# Patient Record
Sex: Female | Born: 1960 | Race: White | Hispanic: No | Marital: Single | Smoking: Never smoker
Health system: Southern US, Community
[De-identification: ages and names within clinical notes are randomized; demographics above are authoritative.]

## PROBLEM LIST (undated history)

## (undated) DIAGNOSIS — K5792 Diverticulitis of intestine, part unspecified, without perforation or abscess without bleeding: Secondary | ICD-10-CM

## (undated) DIAGNOSIS — K859 Acute pancreatitis without necrosis or infection, unspecified: Secondary | ICD-10-CM

## (undated) DIAGNOSIS — R569 Unspecified convulsions: Secondary | ICD-10-CM

## (undated) DIAGNOSIS — K219 Gastro-esophageal reflux disease without esophagitis: Secondary | ICD-10-CM

## (undated) DIAGNOSIS — M8580 Other specified disorders of bone density and structure, unspecified site: Secondary | ICD-10-CM

## (undated) DIAGNOSIS — C541 Malignant neoplasm of endometrium: Secondary | ICD-10-CM

## (undated) DIAGNOSIS — M1990 Unspecified osteoarthritis, unspecified site: Secondary | ICD-10-CM

## (undated) HISTORY — PX: NOSE SURGERY: SHX723

## (undated) HISTORY — PX: ELBOW SURGERY: SHX618

## (undated) HISTORY — DX: Unspecified convulsions: R56.9

## (undated) HISTORY — PX: NASAL SINUS SURGERY: SHX719

## (undated) HISTORY — PX: BREAST SURGERY: SHX581

## (undated) HISTORY — DX: Diverticulitis of intestine, part unspecified, without perforation or abscess without bleeding: K57.92

## (undated) HISTORY — DX: Acute pancreatitis without necrosis or infection, unspecified: K85.90

## (undated) HISTORY — PX: HERNIA REPAIR: SHX51

## (undated) HISTORY — PX: BLADDER DIVERTICULECTOMY: SHX1235

## (undated) HISTORY — PX: UMBILICAL HERNIA REPAIR: SHX196

## (undated) HISTORY — PX: ROTATOR CUFF REPAIR: SHX139

## (undated) HISTORY — DX: Other specified disorders of bone density and structure, unspecified site: M85.80

## (undated) HISTORY — PX: PANCREATECTOMY: SHX1019

## (undated) HISTORY — DX: Gastro-esophageal reflux disease without esophagitis: K21.9

---

## 1979-03-02 HISTORY — PX: UMBILICAL HERNIA REPAIR: SHX196

## 1979-03-02 HISTORY — PX: NASAL SINUS SURGERY: SHX719

## 1998-06-18 ENCOUNTER — Other Ambulatory Visit: Admission: RE | Admit: 1998-06-18 | Discharge: 1998-06-18 | Payer: Self-pay | Admitting: *Deleted

## 1999-08-13 ENCOUNTER — Other Ambulatory Visit: Admission: RE | Admit: 1999-08-13 | Discharge: 1999-08-13 | Payer: Self-pay | Admitting: Obstetrics and Gynecology

## 2000-08-16 ENCOUNTER — Other Ambulatory Visit: Admission: RE | Admit: 2000-08-16 | Discharge: 2000-08-16 | Payer: Self-pay | Admitting: Obstetrics and Gynecology

## 2000-10-04 ENCOUNTER — Encounter (INDEPENDENT_AMBULATORY_CARE_PROVIDER_SITE_OTHER): Payer: Self-pay | Admitting: Specialist

## 2000-10-04 ENCOUNTER — Ambulatory Visit (HOSPITAL_BASED_OUTPATIENT_CLINIC_OR_DEPARTMENT_OTHER): Admission: RE | Admit: 2000-10-04 | Discharge: 2000-10-04 | Payer: Self-pay | Admitting: General Surgery

## 2001-08-22 ENCOUNTER — Other Ambulatory Visit: Admission: RE | Admit: 2001-08-22 | Discharge: 2001-08-22 | Payer: Self-pay | Admitting: Obstetrics & Gynecology

## 2002-09-06 ENCOUNTER — Other Ambulatory Visit: Admission: RE | Admit: 2002-09-06 | Discharge: 2002-09-06 | Payer: Self-pay | Admitting: Obstetrics & Gynecology

## 2003-09-27 ENCOUNTER — Other Ambulatory Visit: Admission: RE | Admit: 2003-09-27 | Discharge: 2003-09-27 | Payer: Self-pay | Admitting: Obstetrics & Gynecology

## 2004-07-13 ENCOUNTER — Ambulatory Visit (HOSPITAL_COMMUNITY): Admission: RE | Admit: 2004-07-13 | Discharge: 2004-07-13 | Payer: Self-pay | Admitting: Neurology

## 2004-10-26 ENCOUNTER — Other Ambulatory Visit: Admission: RE | Admit: 2004-10-26 | Discharge: 2004-10-26 | Payer: Self-pay | Admitting: Obstetrics & Gynecology

## 2006-12-30 ENCOUNTER — Encounter: Admission: RE | Admit: 2006-12-30 | Discharge: 2006-12-30 | Payer: Self-pay | Admitting: Obstetrics & Gynecology

## 2008-05-03 ENCOUNTER — Encounter: Admission: RE | Admit: 2008-05-03 | Discharge: 2008-05-03 | Payer: Self-pay | Admitting: Surgery

## 2008-10-18 ENCOUNTER — Encounter: Admission: RE | Admit: 2008-10-18 | Discharge: 2008-10-18 | Payer: Self-pay | Admitting: Obstetrics and Gynecology

## 2009-10-02 ENCOUNTER — Encounter: Admission: RE | Admit: 2009-10-02 | Discharge: 2009-10-02 | Payer: Self-pay | Admitting: Gastroenterology

## 2009-11-05 ENCOUNTER — Encounter: Payer: Self-pay | Admitting: Cardiology

## 2009-11-07 ENCOUNTER — Ambulatory Visit: Payer: Self-pay | Admitting: Cardiology

## 2009-11-07 DIAGNOSIS — R079 Chest pain, unspecified: Secondary | ICD-10-CM | POA: Insufficient documentation

## 2009-11-18 ENCOUNTER — Ambulatory Visit: Payer: Self-pay | Admitting: Cardiology

## 2009-11-18 ENCOUNTER — Encounter: Payer: Self-pay | Admitting: Cardiology

## 2009-11-18 ENCOUNTER — Ambulatory Visit: Payer: Self-pay

## 2009-11-18 ENCOUNTER — Ambulatory Visit (HOSPITAL_COMMUNITY): Admission: RE | Admit: 2009-11-18 | Discharge: 2009-11-18 | Payer: Self-pay | Admitting: Cardiology

## 2009-11-19 LAB — CONVERTED CEMR LAB
Albumin: 3.9 g/dL (ref 3.5–5.2)
Cholesterol: 194 mg/dL (ref 0–200)
HDL: 43.4 mg/dL (ref >39.00)
LDL Cholesterol: 130 mg/dL — ABNORMAL HIGH (ref 0–99)
Total CHOL/HDL Ratio: 4
Total Protein: 7.1 g/dL (ref 6.0–8.3)
Triglycerides: 103 mg/dL (ref 0.0–149.0)

## 2009-11-21 ENCOUNTER — Ambulatory Visit: Payer: Self-pay | Admitting: Cardiology

## 2009-12-21 ENCOUNTER — Emergency Department (HOSPITAL_COMMUNITY): Admission: EM | Admit: 2009-12-21 | Discharge: 2009-12-21 | Payer: Self-pay | Admitting: Emergency Medicine

## 2010-03-31 NOTE — Assessment & Plan Note (Signed)
Summary: Karina Aguirre Pia Mau   Referring Provider:  [facility] Primary Provider:  Dr. Bartholomew Crews  CC:  follow up.  History of Present Illness: 50 yo with no prior cardiac history presented initially for evaluation of chest pain.  Several weeks ago, while getting in her car to go to work, patient developed pressure in her central chest off and on all day long.  Pain lasted about 10 minutes at a time.  She was short of breath with the pain.  It was not related to exertion or meals.  She started taking omeprazole and has not had any further pain. Stress echo was done, showing no evidence for ischemia or infarction.   Labs (9/11): LDL 130, HDL 43  Current Medications (verified): 1)  Vitamin D3 2000 Unit Caps (Cholecalciferol) .... Once Daily 2)  Omeprazole 40 Mg Cpdr (Omeprazole) .... Take 2 Tablets As Needed  Allergies: No Known Drug Allergies  Past History:  Past Medical History: 1. History of idiopathic pancreatitis 2. GERD 3. History of seizures 4. Stress echo (9/11): Only reached stage II so below average exercise tolerance.  No chest pain.  No evidence for ischemia.   Family History: Reviewed history from 11/07/2009 and no changes required. Mother with MI in her 74s, aortic stenosis.   Social History: Reviewed history from 11/07/2009 and no changes required. Lives in [place], works at [employer] and part-time at [employer] in [place].  Lives alone.  No ETOH, no smoking.   Vital Signs:  Patient profile:   50 year old female Height:      62 inches Weight:      157 pounds BMI:     28.82 Pulse rate:   62 / minute Resp:     14 per minute BP sitting:   129 / 84  (left arm)  Vitals Entered By: Kem Parkinson (November 21, 2009 8:42 AM)  Physical Exam  General:  Well developed, well nourished, in no acute distress. Neck:  Neck supple, no JVD. No masses, thyromegaly or abnormal cervical nodes. Lungs:  Clear bilaterally to auscultation and percussion. Heart:   Non-displaced PMI, chest non-tender; regular rate and rhythm, S1, S2 without murmurs, rubs or gallops. Carotid upstroke normal, no bruit. Pedals normal pulses. No edema, no varicosities. Abdomen:  Bowel sounds positive; abdomen soft and non-tender without masses, organomegaly, or hernias noted. No hepatosplenomegaly. Extremities:  No clubbing or cyanosis. Neurologic:  Alert and oriented x 3. Psych:  Normal affect.   Impression & Recommendations:  Problem # 1:  CHEST PAIN (ICD-786.50) I suspect that this was due to GERD.  It has completely resolved with the use of omeprazole.  Stress echo showed normal LV systolic function and no evidence for ischemia.  Continue omeprazole and no further workup necessary.   Patient Instructions: 1)  Your physician recommends that you schedule a follow-up appointment as needed with Dr Shirlee Latch.

## 2010-03-31 NOTE — Miscellaneous (Signed)
Summary: Orders Update  Clinical Lists Changes  Orders: Added new Test order of TLB-Lipid Panel ([id number]) - Signed Added new Test order of TLB-Hepatic/Liver Function Pnl ([id number]-HEPATIC) - Signed 

## 2010-03-31 NOTE — Assessment & Plan Note (Signed)
Summary: np6/ atypical chest pain. eval for stress test. pt has bcbs/ gd   Referring Provider:  [facility] Primary Provider:  Dr. Bartholomew Crews  CC:  new pt.  atypical chest pain.  Pt states that she has CP all day Wednesday.  She went to pcp and they felt she should have a cardiac evaluation.  History of Present Illness: 50 yo with no prior cardiac history presents for evaluation of chest pain.  Wednesday, while getting in her car to go to work, patient developed pressure in her central chest off and on all day long.  Pain lasted about 10 minutes at a time.  She was short of breath with the pain.  It was not related to exertion or meals. She saw her primary MD on Wednesday and ECG looked ok.  She began taking omeprazole Wednesday evening and feels like it has been helping the chest pain.  No exertional dyspnea.   ECG (today): NSR, normal  Current Medications (verified): 1)  Vitamin D3 2000 Unit Caps (Cholecalciferol) .... Once Daily 2)  Omeprazole 40 Mg Cpdr (Omeprazole) .... Take 2 Tablets As Needed  Allergies (verified): No Known Drug Allergies  Past History:  Past Medical History: 1. History of idiopathic pancreatitis 2. GERD 3. History of seizures  Family History: Mother with MI in her 42s, aortic stenosis.   Social History: Lives in [place], works at [employer] and part-time at [employer] in [place].  Lives alone.  No ETOH, no smoking.   Review of Systems       All systems reviewed and negative except as per HPI.   Vital Signs:  Patient profile:   50 year old female Height:      62 inches Weight:      159 pounds BMI:     29.19 Pulse rate:   63 / minute Pulse rhythm:   regular BP sitting:   132 / 82  (left arm) Cuff size:   regular  Vitals Entered By: Judithe Modest CMA (November 07, 2009 8:31 AM)  Physical Exam  General:  Well developed, well nourished, in no acute distress. Head:  normocephalic and atraumatic Nose:  no deformity, discharge,  inflammation, or lesions Mouth:  Teeth, gums and palate normal. Oral mucosa normal. Neck:  Neck supple, no JVD. No masses, thyromegaly or abnormal cervical nodes. Lungs:  Clear bilaterally to auscultation and percussion. Heart:  Non-displaced PMI, chest non-tender; regular rate and rhythm, S1, S2 without murmurs, rubs or gallops. Carotid upstroke normal, no bruit. Pedals normal pulses. No edema, no varicosities. Abdomen:  Bowel sounds positive; abdomen soft and non-tender without masses, organomegaly, or hernias noted. No hepatosplenomegaly. Msk:  Back normal, normal gait. Muscle strength and tone normal. Extremities:  No clubbing or cyanosis. Neurologic:  Alert and oriented x 3. Skin:  Intact without lesions or rashes. Psych:  Normal affect.   Impression & Recommendations:  Problem # 1:  CHEST PAIN (ICD-786.50) Atypical chest pain.  It has been getting better with omeprazole.  Suspect the pain has been due to heartburn or gastritis.  I will have her, however, get a stress echo to make sure that there is no evidence for ischemia.    Check lipids/LFTs  Other Orders: Stress Echo (Stress Echo)  Patient Instructions: 1)  Your physician has requested that you have a stress echocardiogram. For further information please visit [email or web address]  Please follow instruction sheet as given. 2)  Your physician recommends that you return for a FASTING lipid profile/liver profile--you  can have this when you have the stress echo. 786.50 3)  Your physician recommends that you schedule a follow-up appointment in: 3 weeks with Dr Shirlee Latch.

## 2010-05-13 LAB — POCT I-STAT, CHEM 8
Chloride: 106 mEq/L (ref 96–112)
HCT: 41 % (ref 36.0–46.0)
Hemoglobin: 13.9 g/dL (ref 12.0–15.0)
Potassium: 3.8 mEq/L (ref 3.5–5.1)
Sodium: 140 mEq/L (ref 135–145)

## 2010-05-13 LAB — DIFFERENTIAL
Basophils Absolute: 0 10*3/uL (ref 0.0–0.1)
Basophils Relative: 0 % (ref 0–1)
Lymphocytes Relative: 10 % — ABNORMAL LOW (ref 12–46)
Monocytes Absolute: 0.9 10*3/uL (ref 0.1–1.0)
Monocytes Relative: 7 % (ref 3–12)
Neutro Abs: 10.5 10*3/uL — ABNORMAL HIGH (ref 1.7–7.7)
Neutrophils Relative %: 83 % — ABNORMAL HIGH (ref 43–77)

## 2010-05-13 LAB — CBC
HCT: 39.2 % (ref 36.0–46.0)
Hemoglobin: 13 g/dL (ref 12.0–15.0)
MCHC: 33.2 g/dL (ref 30.0–36.0)
RDW: 13.1 % (ref 11.5–15.5)
WBC: 12.6 10*3/uL — ABNORMAL HIGH (ref 4.0–10.5)

## 2010-05-13 LAB — URINALYSIS, ROUTINE W REFLEX MICROSCOPIC
Bilirubin Urine: NEGATIVE
Nitrite: NEGATIVE
Specific Gravity, Urine: 1.025 (ref 1.005–1.030)
Urobilinogen, UA: 1 mg/dL (ref 0.0–1.0)
pH: 7.5 (ref 5.0–8.0)

## 2011-08-09 ENCOUNTER — Other Ambulatory Visit: Payer: Self-pay | Admitting: Obstetrics & Gynecology

## 2011-08-09 DIAGNOSIS — Z1231 Encounter for screening mammogram for malignant neoplasm of breast: Secondary | ICD-10-CM

## 2011-08-13 ENCOUNTER — Ambulatory Visit
Admission: RE | Admit: 2011-08-13 | Discharge: 2011-08-13 | Disposition: A | Discharge status: Home / Self Care | Payer: 59 | Source: Ambulatory Visit | Attending: Obstetrics & Gynecology | Admitting: Obstetrics & Gynecology

## 2011-08-13 DIAGNOSIS — Z1231 Encounter for screening mammogram for malignant neoplasm of breast: Secondary | ICD-10-CM

## 2011-12-01 ENCOUNTER — Ambulatory Visit (INDEPENDENT_AMBULATORY_CARE_PROVIDER_SITE_OTHER): Payer: 59 | Admitting: Cardiology

## 2011-12-01 ENCOUNTER — Encounter: Payer: Self-pay | Admitting: Cardiology

## 2011-12-01 VITALS — BP 122/84 | HR 61 | Ht 62.0 in | Wt 163.0 lb

## 2011-12-01 DIAGNOSIS — R079 Chest pain, unspecified: Secondary | ICD-10-CM

## 2011-12-01 MED ORDER — OMEPRAZOLE 20 MG PO CPDR: 20.0000 mg | DELAYED_RELEASE_CAPSULE | Freq: Every day | ORAL | Status: DC

## 2011-12-01 NOTE — Progress Notes (Signed)
Patient ID: Karina Aguirre, female   DOB: [birth year], 51 y.o.   MRN: [id number] PCP: Dr. Eula Fried [facility])  51 yo presents for evaluation of chest pain.  She had a prior chest pain evaluation in 9/11 with a normal stress echo.  The chest pain she had at that time seemed to improve with omeprazole.  She had been doing well until about 1.5 months ago.  Since then, she has been getting episodes of left-sided aching that lasts for 5-10 minutes at a time.  This will happen approximately twice a week.  No definite trigger though it often occurs when she is under stress.  It does not seem to be related to meals or exertion.  She is currently working 2 jobs, which causes her a lot of stress.  No exertional dyspnea.  She does not smoke.    Labs (9/11): LDL 130, HDL 43   ECG: NSR, normal  Allergies:  No Known Drug Allergies   Past Medical History:  1. History of idiopathic pancreatitis  2. GERD  3. History of seizures  4. Stress echo (9/11): Only reached stage II so below average exercise tolerance. No chest pain. No evidence for ischemia.   Family History:  Mother with MI in her late 76s, aortic stenosis.   Social History:  Lives in [place], works at [employer] and part-time at [employer] in [place]. Lives alone. No ETOH, no smoking.   ROS: All systems reviewed and negative except as per HPI.   Current Outpatient Prescriptions  Medication Sig Dispense Refill  . aspirin 81 MG tablet Take 81 mg by mouth daily.      . Cholecalciferol (VITAMIN D PO) Take 2,000 mg by mouth daily.      Marland Kitchen omeprazole (PRILOSEC) 20 MG capsule Take 1 capsule (20 mg total) by mouth daily.  30 capsule  11    BP 122/84  Pulse 61  Ht 5\' 2"  (1.575 m)  Wt 163 lb (73.936 kg)  BMI 29.81 kg/m2 General: NAD Neck: No JVD, no thyromegaly or thyroid nodule.  Lungs: Clear to auscultation bilaterally with normal respiratory effort. CV: Nondisplaced PMI.  Heart regular S1/S2, no S3/S4, no murmur.  No peripheral edema.  No  carotid bruit.  Normal pedal pulses.  Abdomen: Soft, nontender, no hepatosplenomegaly, no distention.  Neurologic: Alert and oriented x 3.  Psych: Normal affect. Extremities: No clubbing or cyanosis.   Assessment/Plan: 1. Chest pain: Atypical, possibly related to stress.  It has come up over the last 2 months. I will get an ETT-myoview for risk stratification.  She will continue ASA 81 mg daily.  I will also have her restart omeprazole 20 mg dailiy to see if it helps her any.  2. I will check lipids.   Marca Ancona 12/01/2011

## 2011-12-01 NOTE — Patient Instructions (Addendum)
Take omeprazole 20mg  daily.  Your physician has requested that you have en exercise stress myoview. For further information please visit [email or web address] Please follow instruction sheet, as given.  Your physician recommends that you return for a FASTING lipid profile /liver profile.   Your physician recommends that you schedule a follow-up appointment as needed with Dr Shirlee Latch.

## 2011-12-14 ENCOUNTER — Encounter (HOSPITAL_COMMUNITY): Payer: 59

## 2011-12-14 ENCOUNTER — Other Ambulatory Visit: Payer: 59

## 2012-04-26 LAB — HM COLONOSCOPY

## 2012-12-25 ENCOUNTER — Other Ambulatory Visit: Payer: Self-pay

## 2012-12-25 DIAGNOSIS — Z1231 Encounter for screening mammogram for malignant neoplasm of breast: Secondary | ICD-10-CM

## 2013-01-19 ENCOUNTER — Ambulatory Visit
Admission: RE | Admit: 2013-01-19 | Discharge: 2013-01-19 | Disposition: A | Discharge status: Home / Self Care | Payer: 59 | Source: Ambulatory Visit

## 2013-01-19 DIAGNOSIS — Z1231 Encounter for screening mammogram for malignant neoplasm of breast: Secondary | ICD-10-CM

## 2013-02-05 ENCOUNTER — Other Ambulatory Visit: Payer: Self-pay | Admitting: Obstetrics & Gynecology

## 2013-02-05 DIAGNOSIS — D1800 Hemangioma unspecified site: Secondary | ICD-10-CM

## 2013-02-19 ENCOUNTER — Ambulatory Visit
Admission: RE | Admit: 2013-02-19 | Discharge: 2013-02-19 | Disposition: A | Discharge status: Home / Self Care | Payer: 59 | Source: Ambulatory Visit | Attending: Obstetrics & Gynecology | Admitting: Obstetrics & Gynecology

## 2013-02-19 DIAGNOSIS — D1800 Hemangioma unspecified site: Secondary | ICD-10-CM

## 2013-04-01 DEATH — deceased

## 2013-04-18 ENCOUNTER — Encounter: Payer: Self-pay | Admitting: Family Medicine

## 2013-04-18 ENCOUNTER — Ambulatory Visit (INDEPENDENT_AMBULATORY_CARE_PROVIDER_SITE_OTHER): Payer: 59 | Admitting: Family Medicine

## 2013-04-18 VITALS — BP 107/74 | HR 74 | Temp 97.3°F | Ht 62.0 in | Wt 169.0 lb

## 2013-04-18 DIAGNOSIS — R109 Unspecified abdominal pain: Secondary | ICD-10-CM

## 2013-04-18 DIAGNOSIS — N390 Urinary tract infection, site not specified: Secondary | ICD-10-CM

## 2013-04-18 LAB — POCT URINALYSIS DIPSTICK
Bilirubin, UA: NEGATIVE
Glucose, UA: NEGATIVE
Ketones, UA: NEGATIVE
Nitrite, UA: NEGATIVE
Protein, UA: NEGATIVE
Spec Grav, UA: 1.01
Urobilinogen, UA: NEGATIVE
pH, UA: 5

## 2013-04-18 LAB — POCT UA - MICROSCOPIC ONLY
Casts, Ur, LPF, POC: NEGATIVE
Crystals, Ur, HPF, POC: NEGATIVE
Mucus, UA: NEGATIVE
Yeast, UA: NEGATIVE

## 2013-04-18 MED ORDER — CIPROFLOXACIN HCL 500 MG PO TABS: 500.0000 mg | ORAL_TABLET | Freq: Two times a day (BID) | ORAL | Status: DC

## 2013-04-18 NOTE — Progress Notes (Signed)
   Subjective:    Patient ID: Karina Aguirre, female    DOB: [birth year], 53 y.o.   MRN: [id number]  HPI This 53 y.o. female presents for evaluation of dysuria.  Review of Systems    No chest pain, SOB, HA, dizziness, vision change, N/V, diarrhea, constipation, dysuria, urinary urgency or frequency, myalgias, arthralgias or rash.  Objective:   Physical Exam  Vital signs noted  Well developed well nourished female.  HEENT - Head atraumatic Normocephalic                Eyes - PERRLA, Conjuctiva - clear Sclera- Clear EOMI                Ears - EAC's Wnl TM's Wnl Gross Hearing WNL                Nose - Nares patent                 Throat - oropharanx wnl Respiratory - Lungs CTA bilateral Cardiac - RRR S1 and S2 without murmur GI - Abdomen soft Nontender and bowel sounds active x 4 Extremities - No edema. Neuro - Grossly intact.      Assessment & Plan:  Abdominal pain - Plan: POCT UA - Microscopic Only, POCT urinalysis dipstick  UTI (lower urinary tract infection) - Plan: ciprofloxacin (CIPRO) 500 MG tablet  Lysbeth Penner FNP

## 2013-04-19 ENCOUNTER — Other Ambulatory Visit: Payer: Self-pay | Admitting: Family Medicine

## 2014-04-18 ENCOUNTER — Encounter: Payer: Self-pay | Admitting: Nurse Practitioner

## 2014-04-18 ENCOUNTER — Ambulatory Visit (INDEPENDENT_AMBULATORY_CARE_PROVIDER_SITE_OTHER): Payer: 59 | Admitting: Nurse Practitioner

## 2014-04-18 VITALS — BP 145/85 | HR 83 | Temp 97.2°F | Ht 62.0 in | Wt 155.0 lb

## 2014-04-18 DIAGNOSIS — L209 Atopic dermatitis, unspecified: Secondary | ICD-10-CM

## 2014-04-18 MED ORDER — METHYLPREDNISOLONE ACETATE 80 MG/ML IJ SUSP
80.0000 mg | Freq: Once | INTRAMUSCULAR | Status: AC
  Administered 2014-04-18: 80 mg via INTRAMUSCULAR

## 2014-04-18 MED ORDER — PREDNISONE 20 MG PO TABS: ORAL_TABLET | ORAL | Status: DC

## 2014-04-18 NOTE — Progress Notes (Signed)
   Subjective:    Patient ID: Karina Aguirre, female    DOB: 1961-01-11, 54 y.o.   MRN: [id number]  HPI PAtient in c/o rash on trunk left buttoks, right hip and right knee- Has been gradually coming on for several weeks- Has seen dermatologist when it was only on her left elbow and they took bx but were unable to tell her what it was. Gave her clobetasol cream which has not helped    Review of Systems  Constitutional: Negative.   HENT: Negative.   Respiratory: Negative.   Cardiovascular: Negative.   Genitourinary: Negative.   Neurological: Negative.   Psychiatric/Behavioral: Negative.   All other systems reviewed and are negative.      Objective:   Physical Exam  Constitutional: She appears well-developed and well-nourished.  Cardiovascular: Normal rate, regular rhythm and normal heart sounds.   Pulmonary/Chest: Effort normal and breath sounds normal.  Skin: Rash (erythematous maculo papular lesions in patches on right hip, right knee and left buttocks) noted.   BP 145/85 mmHg  Pulse 83  Temp(Src) 97.2 F (36.2 C) (Oral)  Ht 5\' 2"  (1.575 m)  Wt 155 lb (70.308 kg)  BMI 28.34 kg/m2        Assessment & Plan:   1. Atopic dermatitis    Meds ordered this encounter  Medications  . methylPREDNISolone acetate (DEPO-MEDROL) injection 80 mg    Sig:   . predniSONE (DELTASONE) 20 MG tablet    Sig: 2 po at sametime daily for 5 days- start tomorrow    Dispense:  10 tablet    Refill:  0    Order Specific Question:  Supervising Provider    Answer:  Chipper Herb [[id number]]   Avoid scratching Benadryl or zyrtec OTC for itching Cool compresses if needed RTO prn  Karina Aguirre Done, FNP

## 2014-05-23 IMAGING — MG MM SCREEN MAMMOGRAM BILATERAL
4 series · 4 of 4 positions shown · non-contrast
Comparison: Previous exam(s).

CLINICAL DATA: Screening.

EXAM:
DIGITAL SCREENING BILATERAL MAMMOGRAM WITH CAD

[R CC]
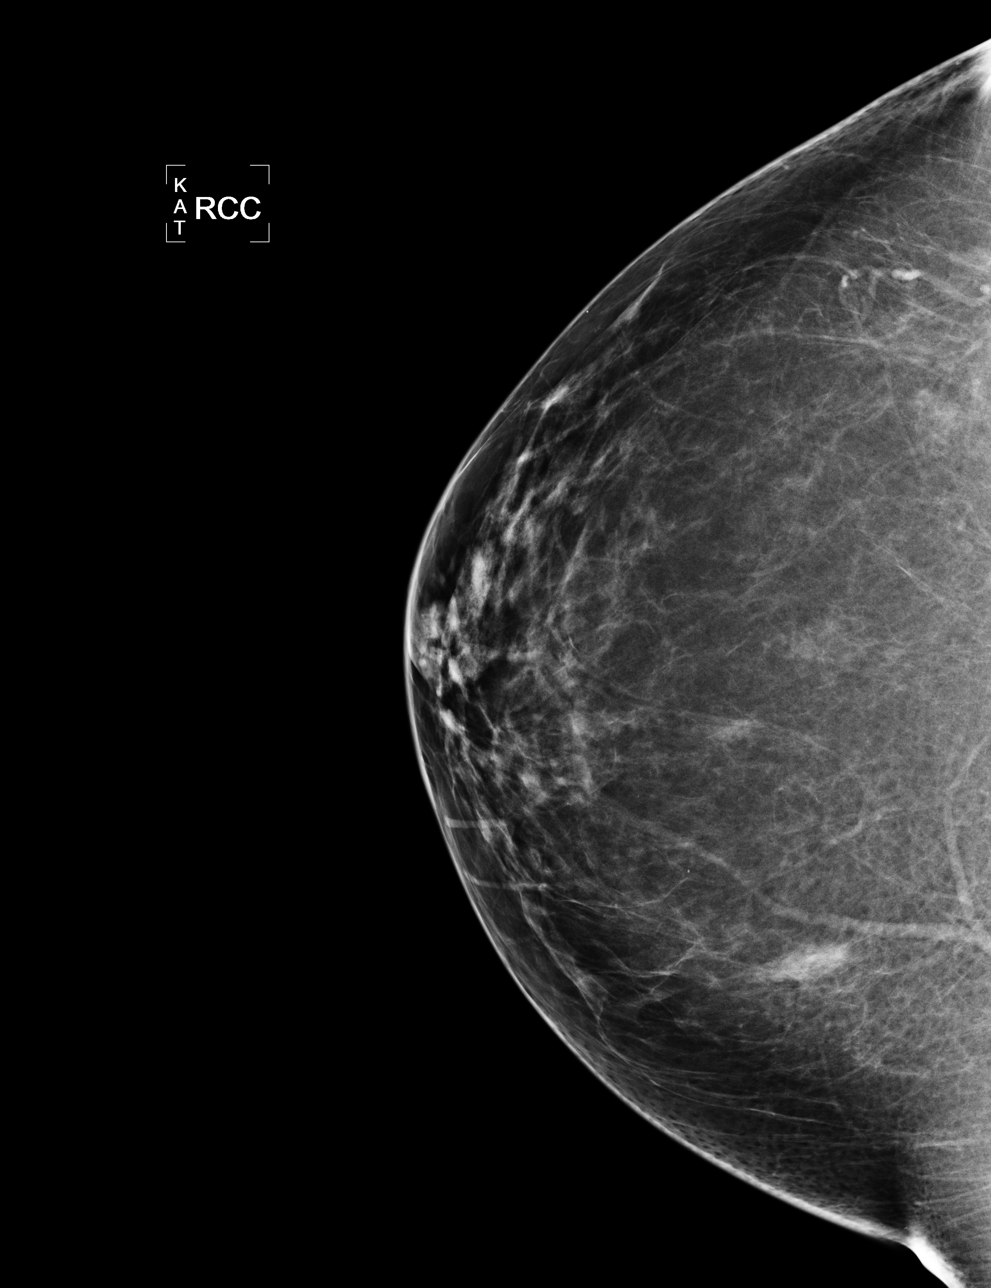

[L CC]
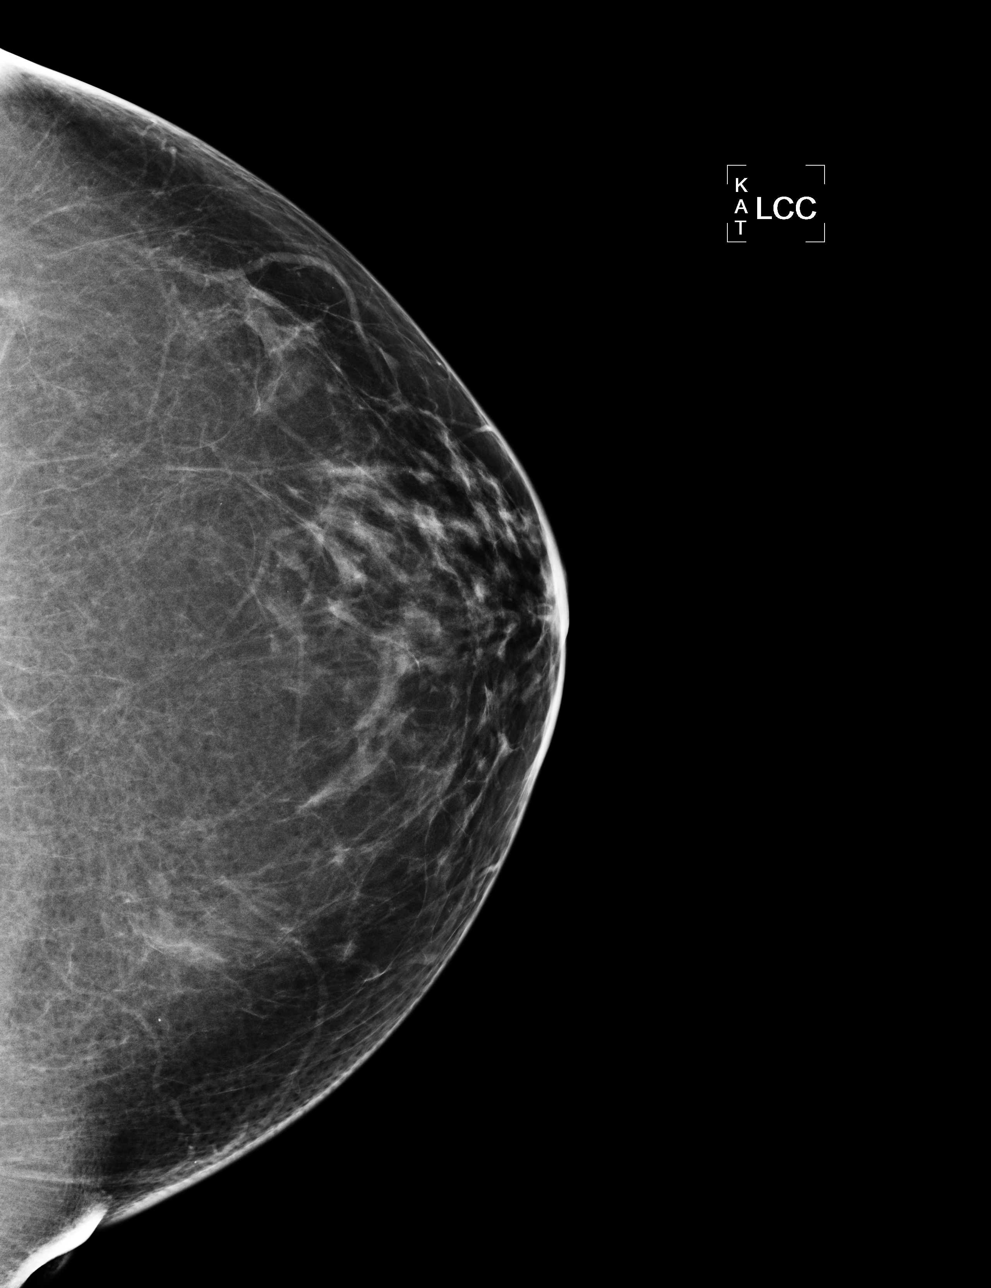

[L MLO]
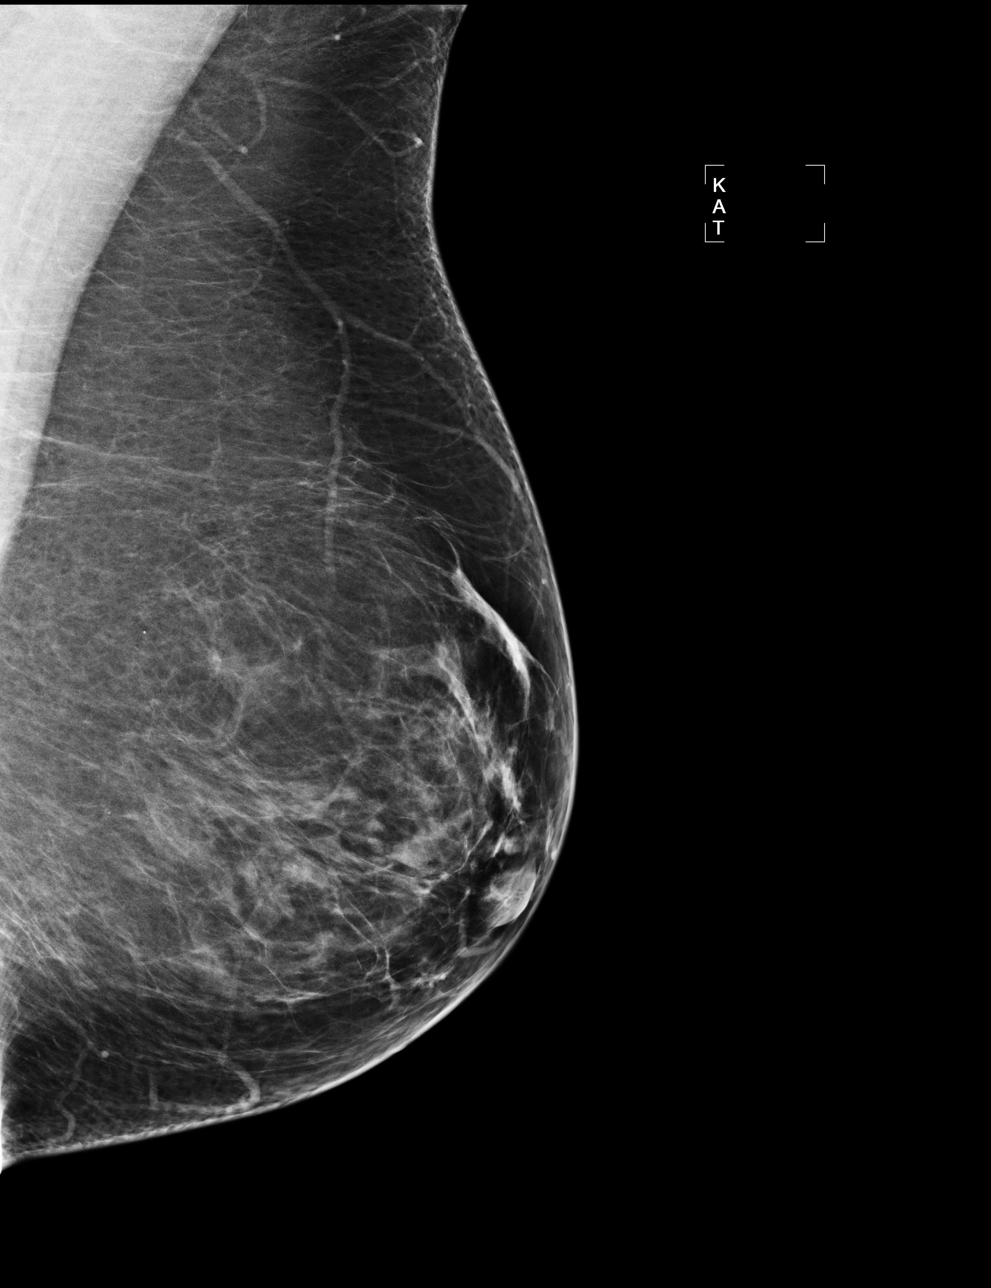

[R MLO]
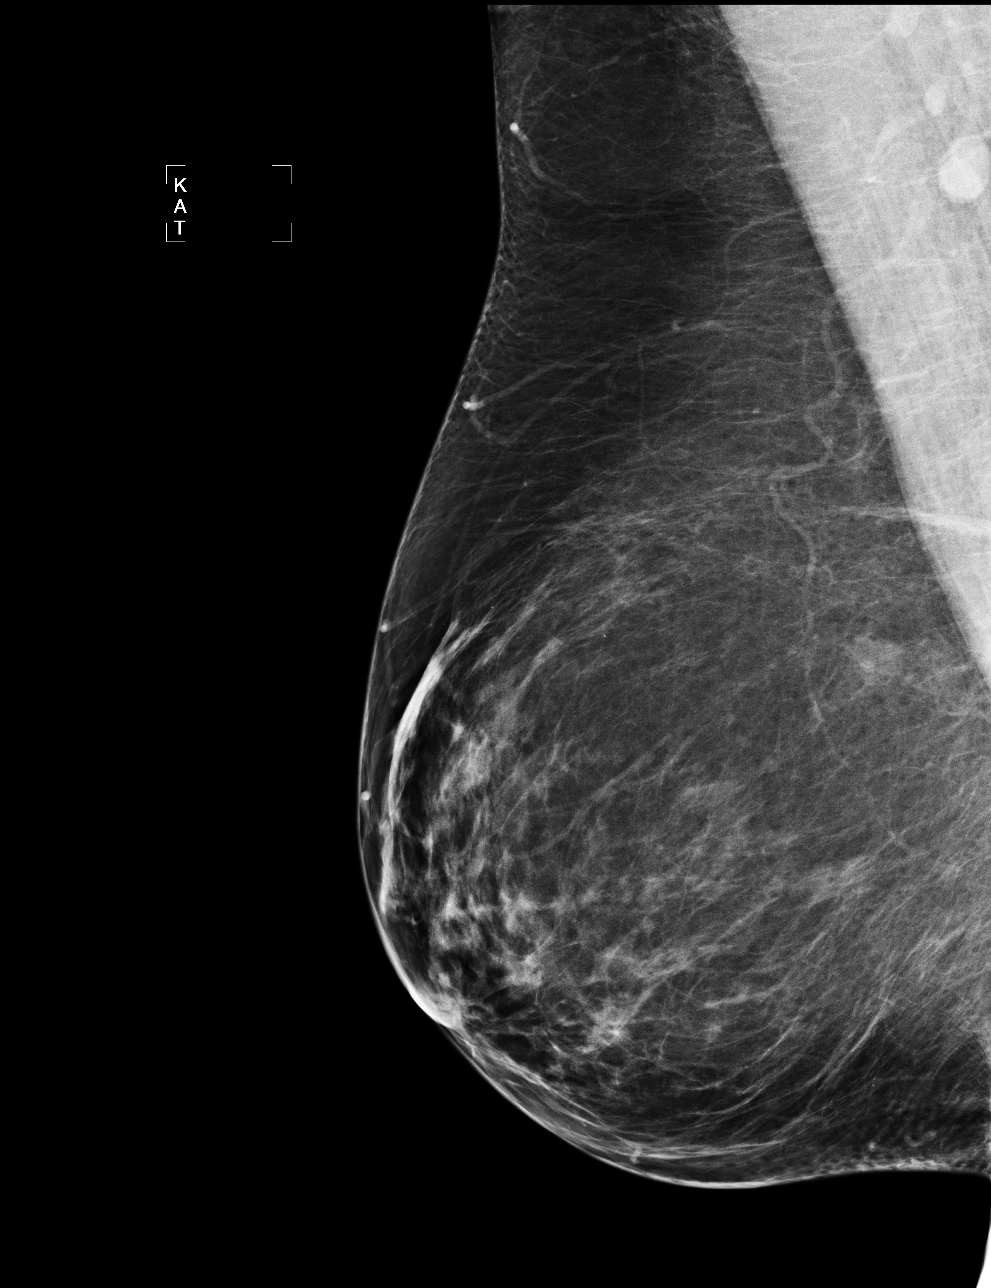

[4 of 4 positions shown; findings below may reference images not displayed]

ACR Breast Density Category b: There are scattered areas of
fibroglandular density.
FINDINGS: There are no findings suspicious for malignancy. Images were
processed with CAD.
IMPRESSION: No mammographic evidence of malignancy. A result letter of this
screening mammogram will be mailed directly to the patient.

RECOMMENDATION:
Screening mammogram in one year. (Code:[id number]-[id number])

BI-RADS CATEGORY  1: Negative

## 2014-06-19 ENCOUNTER — Telehealth: Payer: Self-pay | Admitting: *Deleted

## 2014-06-19 NOTE — Telephone Encounter (Signed)
Spoke with patient regarding elevated potassium level of 5.7 that she had yesterday at [facility].  Reviewed results with MMM- she advised that patient come in today to have redrawn to make sure this was an accurate reading.  Pt agreeable.  Scheduled patient to come in for BMP.

## 2014-06-23 IMAGING — US US ABDOMEN LIMITED
1 series · 14 of 25 positions shown · non-contrast
Comparison: None.

CLINICAL DATA: followup liver hemangioma

EXAM:
US ABDOMEN LIMITED - RIGHT UPPER QUADRANT

[Series 1: us abdomen limited · 0.28mm/px · 14 of 47 slices shown]
[im 1/47]
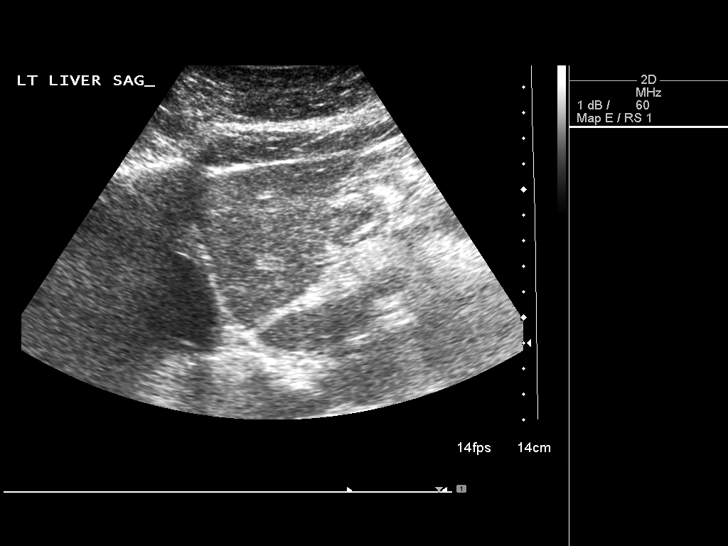
[im 4/47]
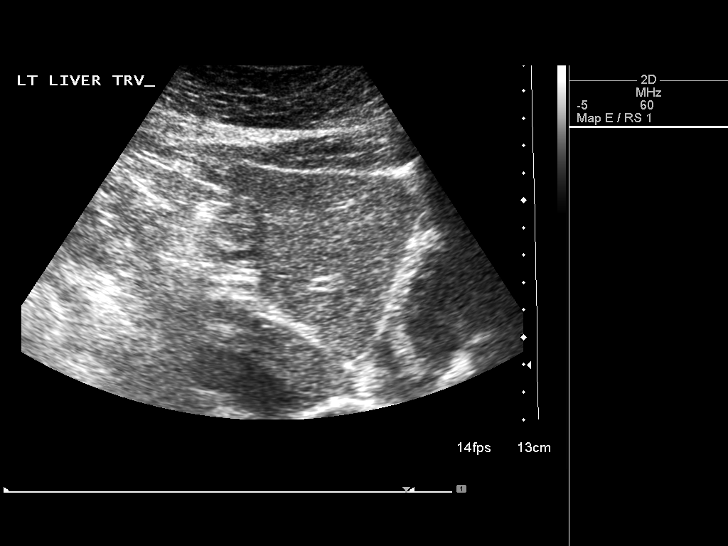
[im 8/47]
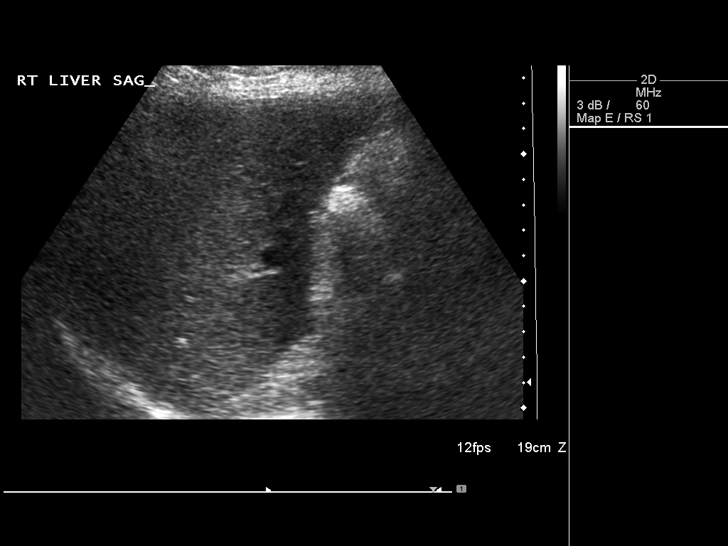
[im 12/47]
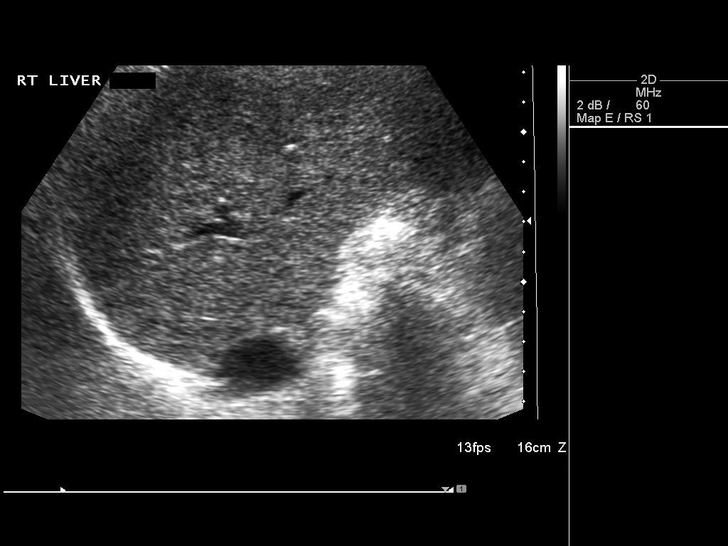
[im 16/47]
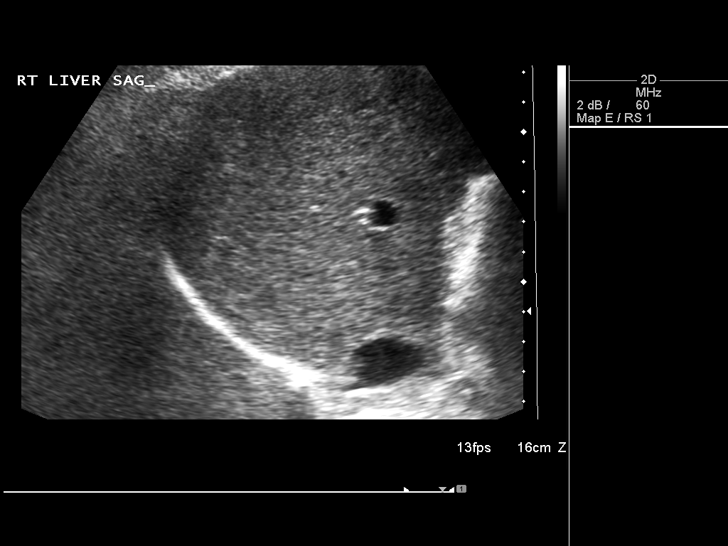
[im 18/47]
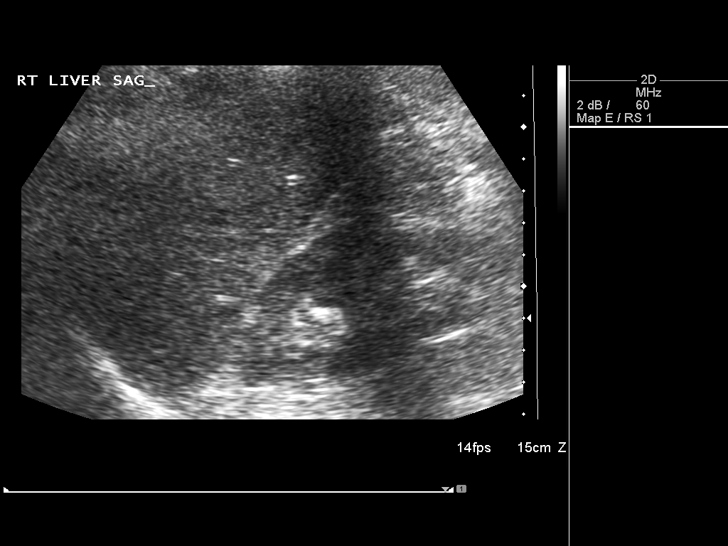
[im 22/47]
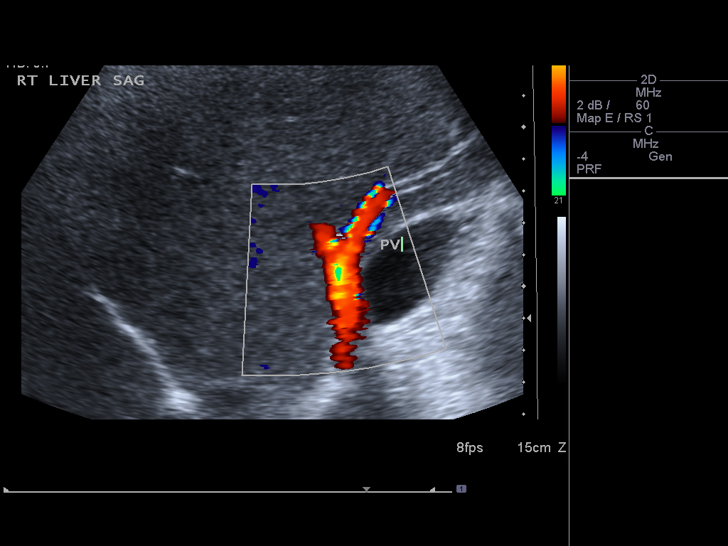
[im 25/47]
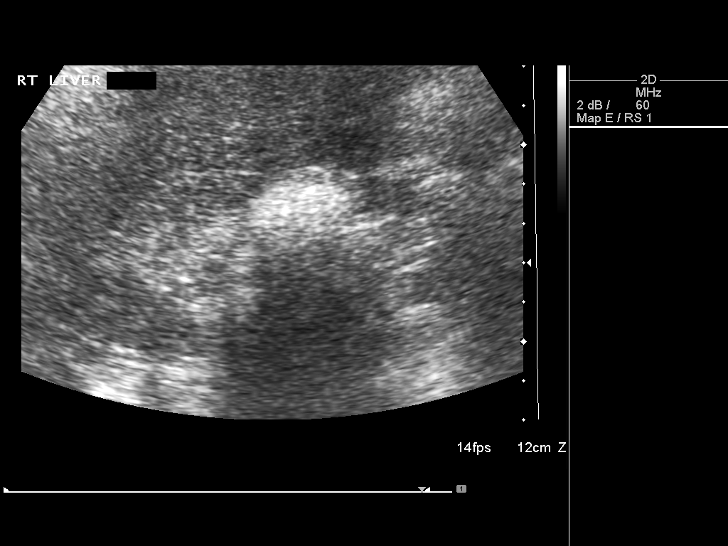
[im 29/47]
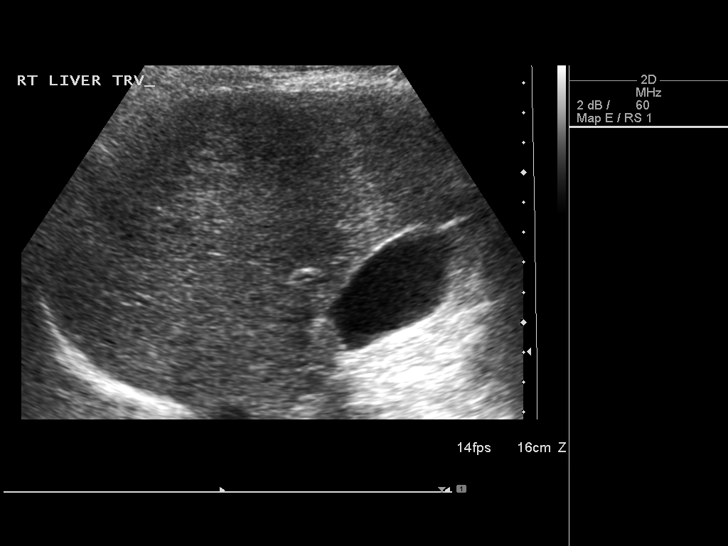
[im 31/47]
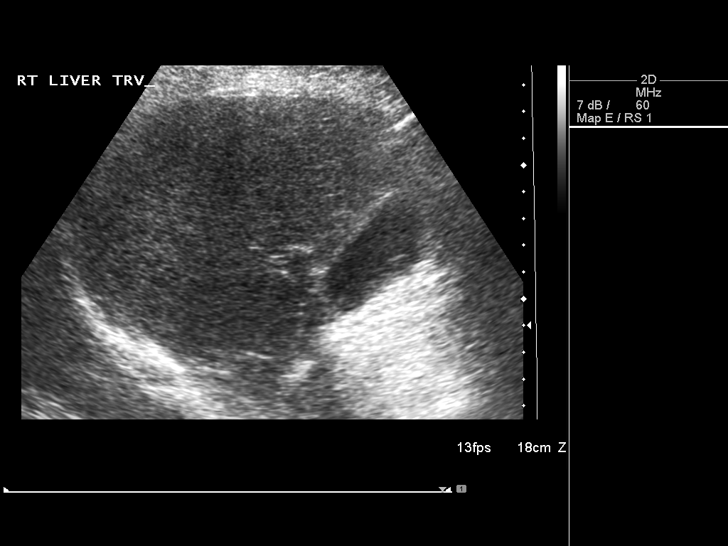
[im 35/47]
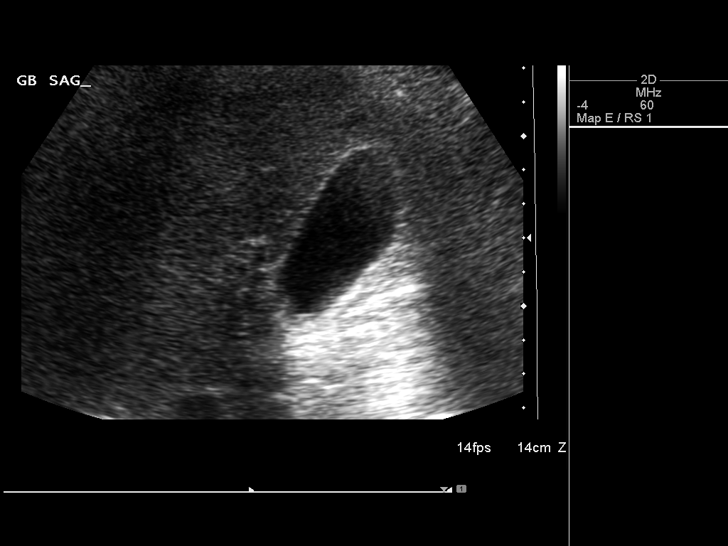
[im 39/47]
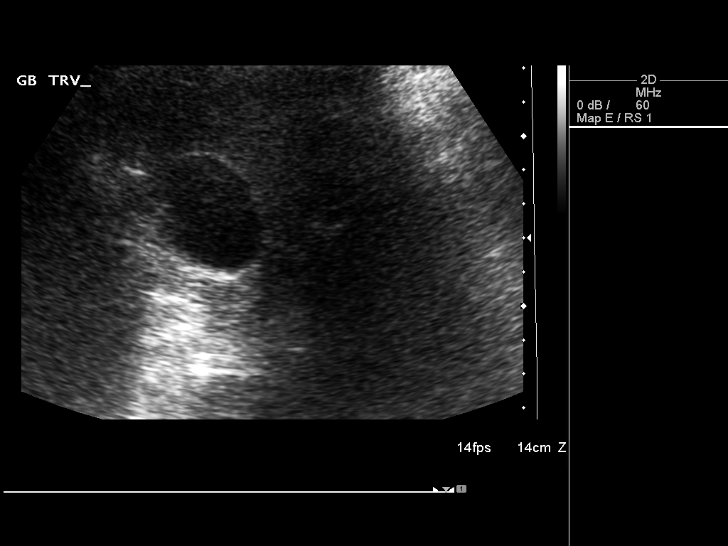
[im 43/47]
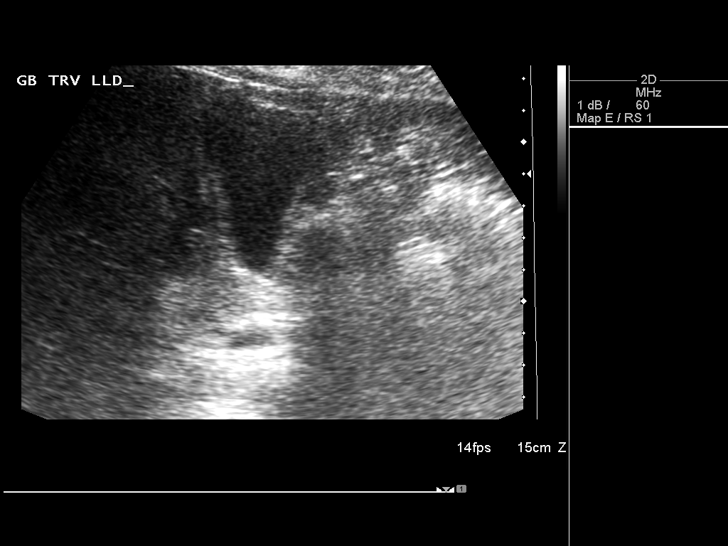
[im 47/47]
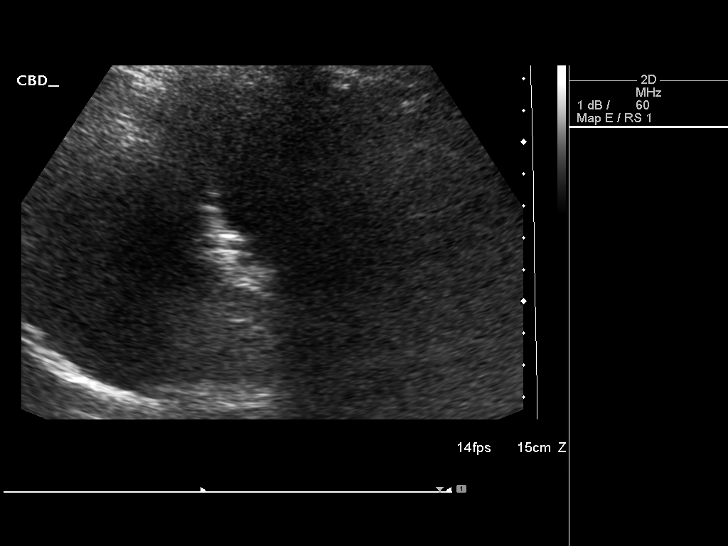

[14 of 25 positions shown; findings below may reference images not displayed]

FINDINGS: Gallbladder

No gallstones or wall thickening visualized. No sonographic Murphy
sign noted.

Common bile duct

Diameter: 4 mm

Liver:

No focal lesion identified. Within normal limits in parenchymal
echogenicity.
IMPRESSION: 1. Normal exam.
2. Previously described liver hemangioma is not identified on
today's exam.

## 2014-07-25 ENCOUNTER — Other Ambulatory Visit (INDEPENDENT_AMBULATORY_CARE_PROVIDER_SITE_OTHER): Payer: 59

## 2014-07-25 DIAGNOSIS — E875 Hyperkalemia: Secondary | ICD-10-CM | POA: Diagnosis not present

## 2014-07-25 NOTE — Progress Notes (Signed)
Lab only 

## 2014-07-26 LAB — POTASSIUM: Potassium: 5.2 mmol/L (ref 3.5–5.2)

## 2014-07-30 ENCOUNTER — Telehealth: Payer: Self-pay | Admitting: Cardiology

## 2014-07-30 ENCOUNTER — Encounter: Payer: Self-pay | Admitting: *Deleted

## 2014-07-30 NOTE — Telephone Encounter (Signed)
LMTCB

## 2014-07-30 NOTE — Telephone Encounter (Signed)
New Message  Pt calling to speak w/ Rn about potassium results taken at [facility] (it was 5.2) Please call back and discuss.

## 2014-07-31 NOTE — Telephone Encounter (Signed)
Spoke with patient about serum potassium in general, advised pt to contact ordering provider for more specific recommendations.

## 2015-04-17 ENCOUNTER — Telehealth: Payer: Self-pay | Admitting: Family Medicine

## 2015-04-18 NOTE — Telephone Encounter (Signed)
Transferred care

## 2015-05-28 LAB — HM COLONOSCOPY

## 2015-12-11 ENCOUNTER — Ambulatory Visit (INDEPENDENT_AMBULATORY_CARE_PROVIDER_SITE_OTHER): Payer: 59 | Admitting: Orthopaedic Surgery

## 2015-12-11 DIAGNOSIS — M25572 Pain in left ankle and joints of left foot: Secondary | ICD-10-CM | POA: Diagnosis not present

## 2015-12-25 ENCOUNTER — Encounter (INDEPENDENT_AMBULATORY_CARE_PROVIDER_SITE_OTHER): Payer: Self-pay | Admitting: Orthopaedic Surgery

## 2015-12-25 ENCOUNTER — Ambulatory Visit (INDEPENDENT_AMBULATORY_CARE_PROVIDER_SITE_OTHER): Payer: 59 | Admitting: Orthopaedic Surgery

## 2015-12-25 VITALS — BP 132/85 | HR 81 | Ht 62.0 in | Wt 155.0 lb

## 2015-12-25 DIAGNOSIS — S93412D Sprain of calcaneofibular ligament of left ankle, subsequent encounter: Secondary | ICD-10-CM

## 2015-12-25 NOTE — Progress Notes (Signed)
Office Visit Note   Patient: Karina Aguirre           Date of Birth: [birth year]           MRN: [id number] Visit Date: 12/25/2015              Requested by: No referring provider defined for this encounter. PCP: No primary care provider on file.   Assessment & Plan: Visit Diagnoses:  1. Sprain of calcaneofibular ligament of left ankle, subsequent encounter     Plan: We'll progress to a Swede-O ankle brace. She may have to use 2 crutches and then progress back to 1 and then off the crutches. She is back at work her full-time job. Other job at [employer] involves a large amount of walking and she's not progressing before she is able to do this for another month work slip given. I discussed with her that on return visit in 4 weeks we'll repeat x-rays 3 views of her ankle if she is not progressed off the crutches. Has possibility of diagnostic imaging.  Follow-Up Instructions: No Follow-up on file.   Orders:  No orders of the defined types were placed in this encounter.  No orders of the defined types were placed in this encounter.     Procedures: No procedures performed   Clinical Data: No additional findings.   Subjective: Chief Complaint  Patient presents with  . Left Ankle - Pain    Patient returns for follow up left ankle injury on 11/26/15.  She is still having difficulty bearing weight without the CAM boot. She states it feels like a knife sticking into her heel. She is ambulating in CAM boot and on one crutch today.     Review of Systems  All other systems reviewed and are negative.    Objective: Vital Signs: BP 132/85   Pulse 81   Ht 5\' 2"  (1.575 m)   Wt 155 lb (70.3 kg)   BMI 28.35 kg/m   Physical Exam  Constitutional: She is oriented to person, place, and time. She appears well-developed.  HENT:  Head: Normocephalic.  Right Ear: External ear normal.  Left Ear: External ear normal.  Eyes: Pupils are equal, round, and reactive to light.  Neck: No  tracheal deviation present. No thyromegaly present.  Cardiovascular: Normal rate.   Pulmonary/Chest: Effort normal.  Abdominal: Soft.  Neurological: She is alert and oriented to person, place, and time.  Skin: Skin is warm and dry.  Psychiatric: She has a normal mood and affect. Her behavior is normal.    Ortho Exam patient's persistent tenderness medial lateral on the calcaneus and exquisite tenderness over the plantar fascia origin. Tenderness of the distal syndesmosis tib-fib ligament and anterior talofibular ligament is still tender. Swelling is down still some mild residual swelling ecchymosis has resolved. She's progressed from 2 crutches to ambulation with one crutch and her CAM boot.  Specialty Comments:  No specialty comments available.  Imaging: No results found.   PMFS History: Patient Active Problem List   Diagnosis Date Noted  . CHEST PAIN 11/07/2009   Past Medical History:  Diagnosis Date  . GERD (gastroesophageal reflux disease)   . Seizure [facility])     Family History  Problem Relation Age of Onset  . Hypertension Father     Past Surgical History:  Procedure Laterality Date  . BLADDER DIVERTICULECTOMY    . BREAST SURGERY     x2  . ELBOW SURGERY    . HERNIA REPAIR    .  NOSE SURGERY     x2  . PANCREATECTOMY     Social History   Occupational History  . Not on file.   Social History Main Topics  . Smoking status: Never Smoker  . Smokeless tobacco: Not on file  . Alcohol use No  . Drug use: No  . Sexual activity: Not on file

## 2015-12-29 ENCOUNTER — Telehealth (INDEPENDENT_AMBULATORY_CARE_PROVIDER_SITE_OTHER): Payer: Self-pay | Admitting: *Deleted

## 2015-12-29 NOTE — Telephone Encounter (Signed)
Pt would like a call from you regarding FRiday appt in [place] regarding her foot. Please call pt back at [phone]

## 2015-12-31 NOTE — Telephone Encounter (Signed)
I called patient. She is unable to wear any of her shoes with the ASO. She wanted to know if she could wear a post op shoe with the ASO so that she can try and come out of the CAM boot. She is unlacing her regular shoes all of the way and still cannot get her foot into them due to the bulk of the ASO. I advised this was ok. She will call if she has any other problems or questions.

## 2016-01-15 ENCOUNTER — Ambulatory Visit (INDEPENDENT_AMBULATORY_CARE_PROVIDER_SITE_OTHER): Payer: 59 | Admitting: Orthopaedic Surgery

## 2016-01-15 ENCOUNTER — Encounter (INDEPENDENT_AMBULATORY_CARE_PROVIDER_SITE_OTHER): Payer: Self-pay | Admitting: Orthopaedic Surgery

## 2016-01-15 ENCOUNTER — Ambulatory Visit (INDEPENDENT_AMBULATORY_CARE_PROVIDER_SITE_OTHER): Payer: 59

## 2016-01-15 VITALS — BP 132/85 | HR 81 | Ht 62.0 in | Wt 155.0 lb

## 2016-01-15 DIAGNOSIS — M25572 Pain in left ankle and joints of left foot: Secondary | ICD-10-CM | POA: Diagnosis not present

## 2016-01-15 NOTE — Progress Notes (Signed)
Office Visit Note   Patient: Karina Aguirre           Date of Birth: 1960/09/24           MRN: [id number] Visit Date: 01/15/2016              Requested by: No referring provider defined for this encounter. PCP: No primary care provider on file.   Assessment & Plan: Visit Diagnoses:  1. Pain in left ankle and joints of left foot     Plan: Patient range ankle sprain laterally on 11/26/2015. She's been using an ASO and with the sore [place] primarily weightbearing more on her toe trying to keep her heel protected has had increased pain over the posterior tibial tendon consistent with the posterior tibial tendinopathy. The calcaneus is not the tenderness after consider injection a day but may need to be considered the in the future. She can use her cam boot some rather than the Swede-O to help with her gait. Continue ice and intermittent elevation. Return one month for follow-up  Follow-Up Instructions: Return in about 1 month (around 02/14/2016).   Orders:  Orders Placed This Encounter  Procedures  . XR Foot Complete Left  . XR Ankle Complete Left   No orders of the defined types were placed in this encounter.     Procedures: No procedures performed   Clinical Data: No additional findings.   Subjective: Chief Complaint  Patient presents with  . Left Ankle - Follow-up    Patient returns for follow up sprain of left calcaneofibular ligament. Date of injury was 11/26/2015.  She was given an ASO at last visit and is wearing that daily.  She also uses a post op shoe sometimes.  She states that her heel is still hurting. She is not using her crutches today.    Review of Systems  Constitutional: Negative for chills and diaphoresis.  HENT: Negative for ear discharge, ear pain and nosebleeds.   Eyes: Negative for discharge and visual disturbance.  Respiratory: Negative for cough, choking and shortness of breath.   Cardiovascular: Negative for chest pain and palpitations.    Gastrointestinal: Negative for abdominal distention and abdominal pain.  Endocrine: Negative for cold intolerance and heat intolerance.  Genitourinary: Negative for flank pain and hematuria.  Skin: Negative for rash and wound.  Neurological: Negative for seizures and speech difficulty.  Hematological: Negative for adenopathy. Does not bruise/bleed easily.  Psychiatric/Behavioral: Negative for agitation and suicidal ideas.     Objective: Vital Signs: Height 52 weight 155 BP 130/85 pulse 81  Physical Exam  Constitutional: She is oriented to person, place, and time. She appears well-developed.  HENT:  Head: Normocephalic.  Right Ear: External ear normal.  Left Ear: External ear normal.  Eyes: Pupils are equal, round, and reactive to light.  Neck: No tracheal deviation present. No thyromegaly present.  Cardiovascular: Normal rate.   Pulmonary/Chest: Effort normal.  Abdominal: Soft.  Musculoskeletal:  Patient has tenderness over the left foot over the plantar fascial origin on the calcaneus. Some tenderness of posterior tibial tendon. She's been ambulatory primarily toe walking with the ASO. Deltoid ligament is normal peroneal tendons are normal.  Neurological: She is alert and oriented to person, place, and time.  Skin: Skin is warm and dry.  Psychiatric: She has a normal mood and affect. Her behavior is normal.    Ortho Exam  Specialty Comments:  No specialty comments available.  Imaging: No results found.   PMFS History: Patient  Active Problem List   Diagnosis Date Noted  . Pain in left ankle and joints of left foot 01/15/2016  . CHEST PAIN 11/07/2009   Past Medical History:  Diagnosis Date  . GERD (gastroesophageal reflux disease)   . Seizure [facility])     Family History  Problem Relation Age of Onset  . Hypertension Father     Past Surgical History:  Procedure Laterality Date  . BLADDER DIVERTICULECTOMY    . BREAST SURGERY     x2  . ELBOW SURGERY    . HERNIA  REPAIR    . NOSE SURGERY     x2  . PANCREATECTOMY     Social History   Occupational History  . Not on file.   Social History Main Topics  . Smoking status: Never Smoker  . Smokeless tobacco: Not on file  . Alcohol use No  . Drug use: No  . Sexual activity: Not on file

## 2016-01-29 ENCOUNTER — Ambulatory Visit (INDEPENDENT_AMBULATORY_CARE_PROVIDER_SITE_OTHER): Payer: 59 | Admitting: Orthopaedic Surgery

## 2016-02-12 ENCOUNTER — Ambulatory Visit (INDEPENDENT_AMBULATORY_CARE_PROVIDER_SITE_OTHER): Payer: 59 | Admitting: Orthopaedic Surgery

## 2016-02-19 ENCOUNTER — Encounter (INDEPENDENT_AMBULATORY_CARE_PROVIDER_SITE_OTHER): Payer: Self-pay | Admitting: Orthopaedic Surgery

## 2016-02-19 ENCOUNTER — Ambulatory Visit (INDEPENDENT_AMBULATORY_CARE_PROVIDER_SITE_OTHER): Payer: 59 | Admitting: Orthopaedic Surgery

## 2016-02-19 VITALS — BP 109/72 | HR 69 | Ht 62.0 in | Wt 155.0 lb

## 2016-02-19 DIAGNOSIS — M25572 Pain in left ankle and joints of left foot: Secondary | ICD-10-CM

## 2016-02-19 NOTE — Progress Notes (Signed)
Office Visit Note   Patient: Karina Aguirre           Date of Birth: Feb 28, 1961           MRN: [id number] Visit Date: 02/19/2016              Requested by: No referring provider defined for this encounter. PCP: No primary care provider on file.   Assessment & Plan: Visit Diagnoses:  1. Pain in left ankle and joints of left foot     Plan:   Follow-Up Instructions: Return if symptoms worsen or fail to improve.   Orders:  No orders of the defined types were placed in this encounter.  No orders of the defined types were placed in this encounter.     Procedures: No procedures performed   Clinical Data: No additional findings.   Subjective: Chief Complaint  Patient presents with  . Left Ankle - Follow-up  . Left Foot - Follow-up    Patient returns for follow up sprain left tibiocalcanear ligament on 11/26/2015. She is still in the CAM boot today. She states that she did transition to ankle brace this weekend and did ok with that and a shoe. She requests a note to go back to work at [employer] with restrictions.    Review of Systems 14 point review of systems updated from last month and is unchanged other than above.   Objective: Vital Signs: BP 109/72   Pulse 69   Ht 5\' 2"  (1.575 m)   Wt 155 lb (70.3 kg)   BMI 28.35 kg/m   Physical Exam  Constitutional: She is oriented to person, place, and time. She appears well-developed.  HENT:  Head: Normocephalic.  Right Ear: External ear normal.  Left Ear: External ear normal.  Eyes: Pupils are equal, round, and reactive to light.  Neck: No tracheal deviation present. No thyromegaly present.  Cardiovascular: Normal rate.   Pulmonary/Chest: Effort normal.  Abdominal: Soft.  Musculoskeletal:  Swelling is down in her ankle ecchymosis has resolved. Forefoot motion midfoot subtalar motion is all normal. She is able to heel toe walk in the office top of the cam boot. Normal pulses no edema. Negative anterior drawer.    Neurological: She is alert and oriented to person, place, and time.  Skin: Skin is warm and dry.  Psychiatric: She has a normal mood and affect. Her behavior is normal.    Ortho Exam  Specialty Comments:  No specialty comments available.  Imaging: No results found.   PMFS History: Patient Active Problem List   Diagnosis Date Noted  . Pain in left ankle and joints of left foot 01/15/2016  . CHEST PAIN 11/07/2009   Past Medical History:  Diagnosis Date  . GERD (gastroesophageal reflux disease)   . Seizure [facility])     Family History  Problem Relation Age of Onset  . Hypertension Father     Past Surgical History:  Procedure Laterality Date  . BLADDER DIVERTICULECTOMY    . BREAST SURGERY     x2  . ELBOW SURGERY    . HERNIA REPAIR    . NOSE SURGERY     x2  . PANCREATECTOMY     Social History   Occupational History  . Not on file.   Social History Main Topics  . Smoking status: Never Smoker  . Smokeless tobacco: Not on file  . Alcohol use No  . Drug use: No  . Sexual activity: Not on file

## 2016-06-21 ENCOUNTER — Ambulatory Visit (INDEPENDENT_AMBULATORY_CARE_PROVIDER_SITE_OTHER): Payer: 59

## 2016-06-21 ENCOUNTER — Ambulatory Visit (INDEPENDENT_AMBULATORY_CARE_PROVIDER_SITE_OTHER): Payer: 59 | Admitting: Orthopaedic Surgery

## 2016-06-21 ENCOUNTER — Encounter (INDEPENDENT_AMBULATORY_CARE_PROVIDER_SITE_OTHER): Payer: Self-pay | Admitting: Orthopaedic Surgery

## 2016-06-21 VITALS — BP 115/77 | HR 79 | Ht 62.0 in | Wt 157.0 lb

## 2016-06-21 DIAGNOSIS — M79641 Pain in right hand: Secondary | ICD-10-CM

## 2016-06-21 DIAGNOSIS — G5602 Carpal tunnel syndrome, left upper limb: Secondary | ICD-10-CM

## 2016-06-21 DIAGNOSIS — G5601 Carpal tunnel syndrome, right upper limb: Secondary | ICD-10-CM | POA: Diagnosis not present

## 2016-06-21 NOTE — Addendum Note (Signed)
Addended by: Meyer Cory on: 06/21/2016 10:57 AM   Modules accepted: Orders

## 2016-06-21 NOTE — Progress Notes (Signed)
Office Visit Note   Patient: Karina Aguirre           Date of Birth: 1960-07-19           MRN: [id number] Visit Date: 06/21/2016              Requested by: No referring provider defined for this encounter. PCP: Asencion Noble, MD   Assessment & Plan: Visit Diagnoses:  1. Pain in right hand   2. Carpal tunnel syndrome of left wrist     Plan: We will obtain some nerve conduction velocities. With thenar atrophy and weakness it is likely that this will confirm the diagnosis. We discussed carpal tunnel release. Currently is not waking her up at night so we will defer night splints. Nerve conduction velocities.  Follow-Up Instructions: No Follow-up on file.   Orders:  Orders Placed This Encounter  Procedures  . XR Hand Complete Right   No orders of the defined types were placed in this encounter.     Procedures: No procedures performed   Clinical Data: No additional findings.   Subjective: Chief Complaint  Patient presents with  . Right Hand - Pain    HPI patient is here with problems with the right hand pain numbness of the radial 3-1/2 fingers. She is left-hand dominant and states the right hand has been like this since she sprained her ankle on had use crutches for about a month. She drops objects frequently. She doesn't have the strength in the hand and bothers her when she drives her user her hand in the kitchen. She's noticed weakness in her hand as well. She denies associated neck pain no fever chills. No history of fracture to the hand. Sedation the ulnar one half fingers is normal. Patient used Tylenol without relief.  Review of Systems 14 part review of systems are reviewed and updated. She denies any cervical pain she does have some chronic back ache. Previous ankle pain. History of chest pain 2011 history of seizures, GERD .   Objective: Vital Signs: BP 115/77   Pulse 79   Ht 5\' 2"  (1.575 m)   Wt 157 lb (71.2 kg)   BMI 28.72 kg/m   Physical Exam    Constitutional: She is oriented to person, place, and time. She appears well-developed.  HENT:  Head: Normocephalic.  Right Ear: External ear normal.  Left Ear: External ear normal.  Eyes: Pupils are equal, round, and reactive to light.  Neck: Normal range of motion. Neck supple. No tracheal deviation present. No thyromegaly present.  Cardiovascular: Normal rate.   Pulmonary/Chest: Effort normal.  Abdominal: Soft.  Musculoskeletal:  Negative Spurling right left a change with the cervical compression. Patient reports this is nontender on the right. No lymphadenopathy. Reflexes are 2+. There is atrophy of the thumb abductor thenar muscle. No atrophy on the opposite left dominant hand. Decreased sensation radial 3-1/2 fingers positive Phalen's positive carpal compression test of Guyon's canal ulnar nerve at the elbow is normal elbow extension good range of motion of the wrist with normal supination pronation extension and flexion. Minimal tendernessof the first metacarpal carpal joint. Mildly positive grind test negative Finkelstein test. Weakness with resisted thumb abduction. Sensation to the ulnar one half fingers is normal.  Neurological: She is alert and oriented to person, place, and time.  Skin: Skin is warm and dry.  Psychiatric: She has a normal mood and affect. Her behavior is normal.    Ortho Exam  Specialty Comments:  No specialty comments  available.  Imaging: Xr Hand Complete Right  Result Date: 06/21/2016 Three-view x-rays right hand are obtained and reviewed. 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 Distal radius is intact. No evidence of ligamentous injury. Impression: Normal right wrist x-rays other than mild degenerative changes base of the thumb.    PMFS History: Patient Active Problem List   Diagnosis Date Noted  . Carpal tunnel syndrome of left wrist 06/21/2016  . Pain in left ankle and joints of left foot 01/15/2016  . CHEST  PAIN 11/07/2009   Past Medical History:  Diagnosis Date  . GERD (gastroesophageal reflux disease)   . Seizure [facility])     Family History  Problem Relation Age of Onset  . Hypertension Father     Past Surgical History:  Procedure Laterality Date  . BLADDER DIVERTICULECTOMY    . BREAST SURGERY     x2  . ELBOW SURGERY    . HERNIA REPAIR    . NOSE SURGERY     x2  . PANCREATECTOMY     Social History   Occupational History  . Not on file.   Social History Main Topics  . Smoking status: Never Smoker  . Smokeless tobacco: Never Used  . Alcohol use No  . Drug use: No  . Sexual activity: Not on file

## 2016-07-02 ENCOUNTER — Ambulatory Visit (INDEPENDENT_AMBULATORY_CARE_PROVIDER_SITE_OTHER): Payer: 59 | Admitting: Physical Medicine and Rehabilitation

## 2016-07-02 ENCOUNTER — Encounter (INDEPENDENT_AMBULATORY_CARE_PROVIDER_SITE_OTHER): Payer: Self-pay | Admitting: Physical Medicine and Rehabilitation

## 2016-07-02 DIAGNOSIS — R531 Weakness: Secondary | ICD-10-CM

## 2016-07-02 DIAGNOSIS — R202 Paresthesia of skin: Secondary | ICD-10-CM

## 2016-07-02 NOTE — Progress Notes (Addendum)
Karina Aguirre - 56 y.o. female MRN [id number]  Date of birth: 1961/01/15  Office Visit Note: Visit Date: 07/02/2016 PCP: Asencion Noble, MD Referred by: Asencion Noble, MD  Subjective: Chief Complaint  Patient presents with  . Right Hand - Pain  . Weakness   HPI: Karina Aguirre is a left hand dominant 56 year old female followed by Dr. Lorin Mercy. She reports several months of worsening right hand pain. Particularly over the last month and hasn't really worsened to a great degree and is severe in nature. She reports worsening with increase usage of the hand. She feels like there is decreased strength overall she is dropping objects. She denies any pain radiating up the arm or down the arm. No frank radicular symptoms or frank injury. She does have [place] arthritis with acute pain with palpation over this joint. She has what appears to be some flattening of the APB but I think this is related to the arthritis and not true atrophy.    ROS Otherwise per HPI.  Assessment & Plan: Visit Diagnoses:  1. Paresthesia of skin   2. Weakness     Plan: No additional findings.  Impression: The above electrodiagnostic study is ABNORMAL and reveals evidence of a VERY mild right median nerve entrapment at the wrist affecting sensory components. Careful clinical correlation is paramount as this would not explain the totality of her symptoms. **Clinically I think a lot of her pain may be from the [facility] joint on the right.  There is no significant electrodiagnostic evidence of any other focal nerve entrapment, brachial plexopathy or cervical radiculopathy.    This electrodiagnostic study cannot rule out small fiber polyneuropathy and dysesthesias from central pain sensitization syndromes such as fibromyalgia.  Recommendations: 1.  Follow-up with referring physician. 2.  Continue current management of symptoms.   Meds & Orders: No orders of the defined types were placed in this encounter.   Orders Placed This Encounter    Procedures  . NCV with EMG (electromyography)    Follow-up: Return for Dr. Lorin Mercy.   Procedures: No procedures performed  EMG & NCV Findings: Evaluation of the right median (across palm) sensory nerve showed prolonged distal peak latency (Wrist, 3.8 ms).  All remaining nerves (as indicated in the following tables) were within normal limits.    All examined muscles (as indicated in the following table) showed no evidence of electrical instability.    Impression: The above electrodiagnostic study is ABNORMAL and reveals evidence of a VERY mild right median nerve entrapment at the wrist affecting sensory components. Careful clinical correlation is paramount as this would not explain the totality of her symptoms.  There is no significant electrodiagnostic evidence of any other focal nerve entrapment, brachial plexopathy or cervical radiculopathy.    This electrodiagnostic study cannot rule out small fiber polyneuropathy and dysesthesias from central pain sensitization syndromes such as fibromyalgia.  Recommendations: 1.  Follow-up with referring physician. 2.  Continue current management of symptoms.  Nerve Conduction Studies Anti Sensory Summary Table   Stim Site NR Peak (ms) Norm Peak (ms) P-T Amp (V) Norm P-T Amp Site1 Site2 Delta-P (ms) Dist (cm) Vel (m/s) Norm Vel (m/s)  Right Median Acr Palm Anti Sensory (2nd Digit)  31.2C  Wrist    *3.8 <3.6 32.7 >10 Wrist Palm 1.8 0.0    Palm    2.0 <2.0 37.4         Right Radial Anti Sensory (Base 1st Digit)  32.7C  Wrist    1.9 <3.1  20.6  Wrist Base 1st Digit 1.9 0.0    Right Ulnar Anti Sensory (5th Digit)  31.8C  Wrist    3.2 <3.7 15.7 >15.0 Wrist 5th Digit 3.2 14.0 44 >38   Motor Summary Table   Stim Site NR Onset (ms) Norm Onset (ms) O-P Amp (mV) Norm O-P Amp Site1 Site2 Delta-0 (ms) Dist (cm) Vel (m/s) Norm Vel (m/s)  Right Median Motor (Abd Poll Brev)  32.8C  Wrist    3.8 <4.2 7.7 >5 Elbow Wrist 3.6 21.0 58 >50  Elbow    7.4   6.3         Right Ulnar Motor (Abd Dig Min)  32.8C  Wrist    2.7 <4.2 9.2 >3 B Elbow Wrist 3.2 21.0 66 >53  B Elbow    5.9  9.3  A Elbow B Elbow 1.3 10.0 77 >53  A Elbow    7.2  9.0          EMG   Side Muscle Nerve Root Ins Act Fibs Psw Amp Dur Poly Recrt Int Fraser Din Comment  Right Abd Poll Brev Median C8-T1 Nml Nml Nml Nml Nml 0 Nml Nml   Right 1stDorInt Ulnar C8-T1 Nml Nml Nml Nml Nml 0 Nml Nml   Right PronatorTeres Median C6-7 Nml Nml Nml Nml Nml 0 Nml Nml   Right Biceps Musculocut C5-6 Nml Nml Nml Nml Nml 0 Nml Nml   Right Deltoid Axillary C5-6 Nml Nml Nml Nml Nml 0 Nml Nml     Nerve Conduction Studies Anti Sensory Left/Right Comparison   Stim Site L Lat (ms) R Lat (ms) L-R Lat (ms) L Amp (V) R Amp (V) L-R Amp (%) Site1 Site2 L Vel (m/s) R Vel (m/s) L-R Vel (m/s)  Median Acr Palm Anti Sensory (2nd Digit)  31.2C  Wrist  *3.8   32.7  Wrist Palm     Palm  2.0   37.4        Radial Anti Sensory (Base 1st Digit)  32.7C  Wrist  1.9   20.6  Wrist Base 1st Digit     Ulnar Anti Sensory (5th Digit)  31.8C  Wrist  3.2   15.7  Wrist 5th Digit  44    Motor Left/Right Comparison   Stim Site L Lat (ms) R Lat (ms) L-R Lat (ms) L Amp (mV) R Amp (mV) L-R Amp (%) Site1 Site2 L Vel (m/s) R Vel (m/s) L-R Vel (m/s)  Median Motor (Abd Poll Brev)  32.8C  Wrist  3.8   7.7  Elbow Wrist  58   Elbow  7.4   6.3        Ulnar Motor (Abd Dig Min)  32.8C  Wrist  2.7   9.2  B Elbow Wrist  66   B Elbow  5.9   9.3  A Elbow B Elbow  77   A Elbow  7.2   9.0              Clinical History: No specialty comments available.  She reports that she has never smoked. She has never used smokeless tobacco. No results for input(s): HGBA1C, LABURIC in the last 8760 hours.  Objective:  VS:  HT:    WT:   BMI:     BP:   HR: bpm  TEMP: ( )  RESP:  Physical Exam  Musculoskeletal:  Inspection reveals no atrophy of the bilateral APB or FDI or hand intrinsics. 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 There is no swelling, color changes, allodynia or dystrophic changes. There is 5 out of 5 strength in the bilateral wrist extension, finger abduction and long finger flexion. There is intact sensation to light touch in all dermatomal and peripheral nerve distributions. There is a negative Phalen's test bilaterally. There is a negative Hoffmann's test bilaterally.    Ortho Exam Imaging: No results found.  Past Medical/Family/Surgical/Social History: Medications & Allergies reviewed per EMR Patient Active Problem List   Diagnosis Date Noted  . Carpal tunnel syndrome of left wrist 06/21/2016  . Pain in left ankle and joints of left foot 01/15/2016  . CHEST PAIN 11/07/2009   Past Medical History:  Diagnosis Date  . GERD (gastroesophageal reflux disease)   . Seizure [facility])    Family History  Problem Relation Age of Onset  . Hypertension Father    Past Surgical History:  Procedure Laterality Date  . BLADDER DIVERTICULECTOMY    . BREAST SURGERY     x2  . ELBOW SURGERY    . HERNIA REPAIR    . NOSE SURGERY     x2  . PANCREATECTOMY     Social History   Occupational History  . Not on file.   Social History Main Topics  . Smoking status: Never Smoker  . Smokeless tobacco: Never Used  . Alcohol use No  . Drug use: No  . Sexual activity: Not on file

## 2016-07-02 NOTE — Progress Notes (Deleted)
Right hand pain and numbness for several months. Worse this past month. Worse with increased usage. Decreased strength. Denies any pain radiating up arm.  Left hand dominant. No lotion.

## 2016-07-05 NOTE — Procedures (Signed)
EMG & NCV Findings: Evaluation of the right median (across palm) sensory nerve showed prolonged distal peak latency (Wrist, 3.8 ms).  All remaining nerves (as indicated in the following tables) were within normal limits.    All examined muscles (as indicated in the following table) showed no evidence of electrical instability.    Impression: The above electrodiagnostic study is ABNORMAL and reveals evidence of a VERY mild right median nerve entrapment at the wrist affecting sensory components. Careful clinical correlation is paramount as this would not explain the totality of her symptoms.  There is no significant electrodiagnostic evidence of any other focal nerve entrapment, brachial plexopathy or cervical radiculopathy.    This electrodiagnostic study cannot rule out small fiber polyneuropathy and dysesthesias from central pain sensitization syndromes such as fibromyalgia.  Recommendations: 1.  Follow-up with referring physician. 2.  Continue current management of symptoms.  Nerve Conduction Studies Anti Sensory Summary Table   Stim Site NR Peak (ms) Norm Peak (ms) P-T Amp (V) Norm P-T Amp Site1 Site2 Delta-P (ms) Dist (cm) Vel (m/s) Norm Vel (m/s)  Right Median Acr Palm Anti Sensory (2nd Digit)  31.2C  Wrist    *3.8 <3.6 32.7 >10 Wrist Palm 1.8 0.0    Palm    2.0 <2.0 37.4         Right Radial Anti Sensory (Base 1st Digit)  32.7C  Wrist    1.9 <3.1 20.6  Wrist Base 1st Digit 1.9 0.0    Right Ulnar Anti Sensory (5th Digit)  31.8C  Wrist    3.2 <3.7 15.7 >15.0 Wrist 5th Digit 3.2 14.0 44 >38   Motor Summary Table   Stim Site NR Onset (ms) Norm Onset (ms) O-P Amp (mV) Norm O-P Amp Site1 Site2 Delta-0 (ms) Dist (cm) Vel (m/s) Norm Vel (m/s)  Right Median Motor (Abd Poll Brev)  32.8C  Wrist    3.8 <4.2 7.7 >5 Elbow Wrist 3.6 21.0 58 >50  Elbow    7.4  6.3         Right Ulnar Motor (Abd Dig Min)  32.8C  Wrist    2.7 <4.2 9.2 >3 B Elbow Wrist 3.2 21.0 66 >53  B Elbow    5.9   9.3  A Elbow B Elbow 1.3 10.0 77 >53  A Elbow    7.2  9.0          EMG   Side Muscle Nerve Root Ins Act Fibs Psw Amp Dur Poly Recrt Int Fraser Din Comment  Right Abd Poll Brev Median C8-T1 Nml Nml Nml Nml Nml 0 Nml Nml   Right 1stDorInt Ulnar C8-T1 Nml Nml Nml Nml Nml 0 Nml Nml   Right PronatorTeres Median C6-7 Nml Nml Nml Nml Nml 0 Nml Nml   Right Biceps Musculocut C5-6 Nml Nml Nml Nml Nml 0 Nml Nml   Right Deltoid Axillary C5-6 Nml Nml Nml Nml Nml 0 Nml Nml     Nerve Conduction Studies Anti Sensory Left/Right Comparison   Stim Site L Lat (ms) R Lat (ms) L-R Lat (ms) L Amp (V) R Amp (V) L-R Amp (%) Site1 Site2 L Vel (m/s) R Vel (m/s) L-R Vel (m/s)  Median Acr Palm Anti Sensory (2nd Digit)  31.2C  Wrist  *3.8   32.7  Wrist Palm     Palm  2.0   37.4        Radial Anti Sensory (Base 1st Digit)  32.7C  Wrist  1.9   20.6  Wrist Base 1st  Digit     Ulnar Anti Sensory (5th Digit)  31.8C  Wrist  3.2   15.7  Wrist 5th Digit  44    Motor Left/Right Comparison   Stim Site L Lat (ms) R Lat (ms) L-R Lat (ms) L Amp (mV) R Amp (mV) L-R Amp (%) Site1 Site2 L Vel (m/s) R Vel (m/s) L-R Vel (m/s)  Median Motor (Abd Poll Brev)  32.8C  Wrist  3.8   7.7  Elbow Wrist  58   Elbow  7.4   6.3        Ulnar Motor (Abd Dig Min)  32.8C  Wrist  2.7   9.2  B Elbow Wrist  66   B Elbow  5.9   9.3  A Elbow B Elbow  77   A Elbow  7.2   9.0

## 2016-07-15 ENCOUNTER — Encounter (INDEPENDENT_AMBULATORY_CARE_PROVIDER_SITE_OTHER): Payer: Self-pay | Admitting: Orthopaedic Surgery

## 2016-07-15 ENCOUNTER — Ambulatory Visit (INDEPENDENT_AMBULATORY_CARE_PROVIDER_SITE_OTHER): Payer: 59 | Admitting: Orthopaedic Surgery

## 2016-07-15 VITALS — BP 124/78 | HR 79 | Ht 62.0 in | Wt 156.0 lb

## 2016-07-15 DIAGNOSIS — G5601 Carpal tunnel syndrome, right upper limb: Secondary | ICD-10-CM

## 2016-07-15 MED ORDER — METHYLPREDNISOLONE ACETATE 40 MG/ML IJ SUSP
13.3300 mg | INTRAMUSCULAR | Status: AC | PRN
  Administered 2016-07-15: 13.33 mg

## 2016-07-15 MED ORDER — BUPIVACAINE HCL 0.25 % IJ SOLN
0.3300 mL | INTRAMUSCULAR | Status: AC | PRN
  Administered 2016-07-15: .33 mL

## 2016-07-15 NOTE — Progress Notes (Signed)
Office Visit Note   Patient: Karina Aguirre           Date of Birth: 30-Jul-1960           MRN: [id number] Visit Date: 07/15/2016              Requested by: Asencion Noble, MD [address] [place], [place] [address] PCP: Asencion Noble, MD   Assessment & Plan: Visit Diagnoses:  1. Carpal tunnel syndrome of right wrist     Plan: She'll return she had very mild carpal tunnel syndrome. Injection performed today and the carpal canal after informed consent which she tolerated well. Was place her in a splint she can use at night to help with her symptoms. Recheck 6 weeks.  Follow-Up Instructions: Return in about 6 weeks (around 08/26/2016).   Orders:  Orders Placed This Encounter  Procedures  . Hand/Upper Extremity Injection/Arthrocentesis   No orders of the defined types were placed in this encounter.     Procedures: Hand/UE Inj Date/Time: 07/15/2016 3:00 PM Performed by: Marybelle Killings Authorized by: Rodell Perna C   Condition: carpal tunnel   Site:  R carpal tunnel Needle Size:  25 G Approach:  Volar Ultrasound Guidance: No   Medications:  0.33 mL bupivacaine 0.25 %; 13.33 mg methylPREDNISolone acetate 40 MG/ML     Clinical Data: No additional findings.   Subjective: Chief Complaint  Patient presents with  . Right Hand - Pain, Follow-up    HPI patient returns ongoing problems with right hand numbness bothers her at night sometimes has to shake her hand. Electrical tests were done which showed very mild carpal tunnel by Dr. Ernestina Patches. She does not have a splint. Patient denies symptoms in her left hand. No gait disturbance. Past history of knee problems hip problems negative for rheumatologic conditions no fever or chills.  Review of Systems 14 point review of systems is updated from last office visit and is unchanged other than history of present illness.   Objective: Vital Signs: BP 124/78   Pulse 79   Ht 5\' 2"  (1.575 m)   Wt 156 lb (70.8 kg)   BMI 28.53 kg/m     Physical Exam  Constitutional: She is oriented to person, place, and time. She appears well-developed.  HENT:  Head: Normocephalic.  Right Ear: External ear normal.  Left Ear: External ear normal.  Eyes: Pupils are equal, round, and reactive to light.  Neck: No tracheal deviation present. No thyromegaly present.  Cardiovascular: Normal rate.   Pulmonary/Chest: Effort normal.  Abdominal: Soft.  Musculoskeletal:  Patient's good cervical range of motion no shoulder impingement. Tenderness over the carpal canal. Negative the Phalen's test pain with carpal pressure. The triggering of the digits. No evidence of extensor tenosynovitis. Mild the thumb degenerative changes trace thenar atrophy on the right versus left hand. No lymphadenopathy no rash or exposed skin.  Neurological: She is alert and oriented to person, place, and time.  Skin: Skin is warm and dry.  Psychiatric: She has a normal mood and affect. Her behavior is normal.    Ortho Exam  Specialty Comments:  No specialty comments available.  Imaging: No results found.   PMFS History: Patient Active Problem List   Diagnosis Date Noted  . Carpal tunnel syndrome of right wrist 06/21/2016  . Pain in left ankle and joints of left foot 01/15/2016  . CHEST PAIN 11/07/2009   Past Medical History:  Diagnosis Date  . GERD (gastroesophageal reflux disease)   .  Seizure [facility])     Family History  Problem Relation Age of Onset  . Hypertension Father     Past Surgical History:  Procedure Laterality Date  . BLADDER DIVERTICULECTOMY    . BREAST SURGERY     x2  . ELBOW SURGERY    . HERNIA REPAIR    . NOSE SURGERY     x2  . PANCREATECTOMY     Social History   Occupational History  . Not on file.   Social History Main Topics  . Smoking status: Never Smoker  . Smokeless tobacco: Never Used  . Alcohol use No  . Drug use: No  . Sexual activity: Not on file

## 2016-07-16 ENCOUNTER — Ambulatory Visit (INDEPENDENT_AMBULATORY_CARE_PROVIDER_SITE_OTHER): Payer: 59 | Admitting: Orthopaedic Surgery

## 2016-08-26 ENCOUNTER — Ambulatory Visit (INDEPENDENT_AMBULATORY_CARE_PROVIDER_SITE_OTHER): Payer: 59 | Admitting: Orthopaedic Surgery

## 2016-11-18 ENCOUNTER — Ambulatory Visit (INDEPENDENT_AMBULATORY_CARE_PROVIDER_SITE_OTHER): Payer: 59

## 2016-11-18 ENCOUNTER — Ambulatory Visit (INDEPENDENT_AMBULATORY_CARE_PROVIDER_SITE_OTHER): Payer: 59 | Admitting: Orthopaedic Surgery

## 2016-11-18 ENCOUNTER — Encounter (INDEPENDENT_AMBULATORY_CARE_PROVIDER_SITE_OTHER): Payer: Self-pay | Admitting: Orthopaedic Surgery

## 2016-11-18 VITALS — BP 123/79 | HR 77

## 2016-11-18 DIAGNOSIS — M545 Low back pain: Secondary | ICD-10-CM

## 2016-11-18 NOTE — Progress Notes (Signed)
Office Visit Note   Patient: Karina Aguirre           Date of Birth: 27-Jun-1960           MRN: [id number] Visit Date: 11/18/2016              Requested by: Asencion Noble, MD [address] [place], [place] [address] PCP: Asencion Noble, MD   Assessment & Plan: Visit Diagnoses:  1. Acute left-sided low back pain, with sciatica presence unspecified     Plan: Patient likely has a disc bulge with some radicular symptoms without weakness. Short of a walking program continues Tylenol and she has increased symptoms she can return for possible cortisone injection.  Follow-Up Instructions: No Follow-up on file.   Orders:  Orders Placed This Encounter  Procedures  . XR Pelvis 1-2 Views  . XR Lumbar Spine 2-3 Views   No orders of the defined types were placed in this encounter.     Procedures: No procedures performed   Clinical Data: No additional findings.   Subjective: Chief Complaint  Patient presents with  . Lower Back - Pain    HPI patient seen for the history of back pain left buttock pain that radiates down stops at her knee. Somewhat radiates anteriorly into the groin. She has had history of back problems in the past. She used some Tylenol and still been able to work she is on her feet a lot. She denies any associated bowel or bladder symptoms no fever chills. Patient still has some mild problems with numbness in her right hand she had borderline mild carpal tunnel syndrome. She doesn't use one at night since she sleeps entirely through the night. She still has some discomfort at the base of her thumb CMC joint on the right. No associated neck discomfort.  Review of Systems 14 previous systems updated and unchanged from 06/21/2016 office visit.   Objective: Vital Signs: BP 123/79   Pulse 77   Physical Exam  Constitutional: She is oriented to person, place, and time. She appears well-developed.  HENT:  Head: Normocephalic.  Right Ear: External ear normal.  Left  Ear: External ear normal.  Eyes: Pupils are equal, round, and reactive to light.  Neck: No tracheal deviation present. No thyromegaly present.  Cardiovascular: Normal rate.   Pulmonary/Chest: Effort normal.  Abdominal: Soft.  Neurological: She is alert and oriented to person, place, and time.  Skin: Skin is warm and dry.  Psychiatric: She has a normal mood and affect. Her behavior is normal.    Ortho Exam patient has some sciatic notch tenderness on the left than right. Bilateral trochanteric bursal tenderness moderate. A straight leg raising 90 she can heel and toe walk for flexion. No scoliosis. She has significant tenderness L4-S1 spinous process which reproduces her back pain that radiates into her left buttocks. No abductor or hamstring weakness anterior tib EHL is strong. Specialty Comments:  No specialty comments available.  Imaging: No results found.   PMFS History: Patient Active Problem List   Diagnosis Date Noted  . Carpal tunnel syndrome of right wrist 06/21/2016  . Pain in left ankle and joints of left foot 01/15/2016  . CHEST PAIN 11/07/2009   Past Medical History:  Diagnosis Date  . GERD (gastroesophageal reflux disease)   . Seizure [facility])     Family History  Problem Relation Age of Onset  . Hypertension Father     Past Surgical History:  Procedure Laterality Date  . BLADDER  DIVERTICULECTOMY    . BREAST SURGERY     x2  . ELBOW SURGERY    . HERNIA REPAIR    . NOSE SURGERY     x2  . PANCREATECTOMY     Social History   Occupational History  . Not on file.   Social History Main Topics  . Smoking status: Never Smoker  . Smokeless tobacco: Never Used  . Alcohol use No  . Drug use: No  . Sexual activity: Not on file

## 2020-06-30 LAB — COLOGUARD
COLOGUARD: NEGATIVE
COLOGUARD: NEGATIVE

## 2021-04-09 ENCOUNTER — Encounter: Payer: Self-pay | Admitting: Orthopaedic Surgery

## 2021-04-09 ENCOUNTER — Ambulatory Visit: Payer: Self-pay

## 2021-04-09 ENCOUNTER — Other Ambulatory Visit: Payer: Self-pay

## 2021-04-09 ENCOUNTER — Ambulatory Visit (INDEPENDENT_AMBULATORY_CARE_PROVIDER_SITE_OTHER): Payer: No Typology Code available for payment source | Admitting: Orthopaedic Surgery

## 2021-04-09 VITALS — Ht 62.0 in | Wt 157.0 lb

## 2021-04-09 DIAGNOSIS — G8929 Other chronic pain: Secondary | ICD-10-CM | POA: Diagnosis not present

## 2021-04-09 DIAGNOSIS — M25552 Pain in left hip: Secondary | ICD-10-CM | POA: Diagnosis not present

## 2021-04-09 DIAGNOSIS — M545 Low back pain, unspecified: Secondary | ICD-10-CM | POA: Diagnosis not present

## 2021-04-09 MED ORDER — PREDNISONE 10 MG (21) PO TBPK: ORAL_TABLET | ORAL | 0 refills | Status: DC

## 2021-04-09 NOTE — Progress Notes (Signed)
Office Visit Note   Patient: Karina Aguirre           Date of Birth: [birth year]           MRN: [id number] Visit Date: 04/09/2021              Requested by: Asencion Noble, MD [address] [place],  [place] [address] PCP: Asencion Noble, MD   Assessment & Plan: Visit Diagnoses:  1. Chronic bilateral low back pain, unspecified whether sciatica present   2. Pain in left hip     Plan: Send in a prednisone Dosepak which she can pick up if her symptoms start recurring.  Follow-Up Instructions: No follow-ups on file.   Orders:  Orders Placed This Encounter  Procedures   XR HIP UNILAT W OR W/O PELVIS 2-3 VIEWS LEFT   XR Lumbar Spine 2-3 Views   No orders of the defined types were placed in this encounter.     Procedures: No procedures performed   Clinical Data: No additional findings.   Subjective: Chief Complaint  Patient presents with   Lower Back - Pain   Left Hip - Pain    HPI 61 year old female returns with recurrent low back pain.  She had an epidural injection that I had ordered about 15 years ago.  I will also take care of her for tennis elbow surgery and also high ankle sprain many years ago.  Patient states she is worn a back brace she is actually better today.  She has had symptoms for about 3 weeks at times it was severe pains in her back radiates into her buttocks worse on the left side than right.  Pain does not go all the way to her foot.  No associated bowel or bladder symptoms.  Review of Systems noncontributory other than as mentioned in HPI.   Objective: Vital Signs: Ht 5\' 2"  (1.575 m)    Wt 157 lb (71.2 kg)    BMI 28.72 kg/m   Physical Exam Constitutional:      Appearance: She is well-developed.  HENT:     Head: Normocephalic.     Right Ear: External ear normal.     Left Ear: External ear normal. There is no impacted cerumen.  Eyes:     Pupils: Pupils are equal, round, and reactive to light.  Neck:     Thyroid: No thyromegaly.      Trachea: No tracheal deviation.  Cardiovascular:     Rate and Rhythm: Normal rate.  Pulmonary:     Effort: Pulmonary effort is normal.  Abdominal:     Palpations: Abdomen is soft.  Musculoskeletal:     Cervical back: No rigidity.  Skin:    General: Skin is warm and dry.  Neurological:     Mental Status: She is alert and oriented to person, place, and time.  Psychiatric:        Behavior: Behavior normal.    Ortho Exam patient has tenderness to the L4-5 interspace.  Sciatic notch tenderness on the left pain with straight leg raising 90 degrees on the left.  Anterior tib EHL is intact active and strong.  Normal lower extremity reflexes negative logroll hips.  Knees reach full extension.  No knee effusion no rash or exposed skin.  Specialty Comments:  No specialty comments available.  Imaging: No results found.   PMFS History: Patient Active Problem List   Diagnosis Date Noted   Carpal tunnel syndrome of right wrist 06/21/2016   Pain  in left ankle and joints of left foot 01/15/2016   CHEST PAIN 11/07/2009   Past Medical History:  Diagnosis Date   GERD (gastroesophageal reflux disease)    Seizure ([place])     Family History  Problem Relation Age of Onset   Hypertension Father     Past Surgical History:  Procedure Laterality Date   BLADDER DIVERTICULECTOMY     BREAST SURGERY     x2   ELBOW SURGERY     HERNIA REPAIR     NOSE SURGERY     x2   PANCREATECTOMY     Social History   Occupational History   Not on file  Tobacco Use   Smoking status: Never   Smokeless tobacco: Never  Substance and Sexual Activity   Alcohol use: No   Drug use: No   Sexual activity: Not on file

## 2021-06-16 ENCOUNTER — Other Ambulatory Visit: Payer: Self-pay | Admitting: Obstetrics and Gynecology

## 2021-06-16 DIAGNOSIS — R112 Nausea with vomiting, unspecified: Secondary | ICD-10-CM

## 2021-06-16 DIAGNOSIS — R1013 Epigastric pain: Secondary | ICD-10-CM

## 2021-06-17 ENCOUNTER — Ambulatory Visit
Admission: RE | Admit: 2021-06-17 | Discharge: 2021-06-17 | Disposition: A | Discharge status: Home / Self Care | Payer: No Typology Code available for payment source | Source: Ambulatory Visit | Attending: Obstetrics and Gynecology | Admitting: Obstetrics and Gynecology

## 2021-06-17 DIAGNOSIS — R112 Nausea with vomiting, unspecified: Secondary | ICD-10-CM

## 2021-06-17 DIAGNOSIS — R1013 Epigastric pain: Secondary | ICD-10-CM

## 2021-10-01 ENCOUNTER — Ambulatory Visit: Payer: No Typology Code available for payment source | Admitting: Orthopaedic Surgery

## 2021-10-01 DIAGNOSIS — M5126 Other intervertebral disc displacement, lumbar region: Secondary | ICD-10-CM | POA: Diagnosis not present

## 2021-10-01 DIAGNOSIS — G8929 Other chronic pain: Secondary | ICD-10-CM

## 2021-10-01 DIAGNOSIS — M545 Low back pain, unspecified: Secondary | ICD-10-CM | POA: Diagnosis not present

## 2021-10-01 NOTE — Progress Notes (Signed)
Office Visit Note   Patient: Karina Aguirre           Date of Birth: 1960-03-04           MRN: [id number] Visit Date: 10/01/2021              Requested by: Asencion Noble, MD [address] [place],  [place] [address] PCP: Asencion Noble, MD   Assessment & Plan: Visit Diagnoses:  1. Chronic bilateral low back pain, unspecified whether sciatica present   2. Protrusion of lumbar intervertebral disc     Plan: We will set up for single epidural injection.  She previously got excellent relief for many years.  I gave her a copy of the MRI report with the the L3-4 lateral disc and foraminal protrusion with lateral recess stenosis when she had the same symptoms.  She can follow-up with me if the epidurals not effective  Follow-Up Instructions: No follow-ups on file.   Orders:  Orders Placed This Encounter  Procedures   Ambulatory referral to Physical Medicine Rehab   No orders of the defined types were placed in this encounter.     Procedures: No procedures performed   Clinical Data: No additional findings.   Subjective: Chief Complaint  Patient presents with   Left Hip - Pain    HPI 61 year old female returns for recurrent problems with left L3-4 lateral disc protrusion.  Epidural 15 years ago gave her excellent relief.  MRI scan 12/03/2010 showed far lateral disc protrusion left lateral recess encroachment on the left L3 nerve root.  She states she cannot sleep on her left side whenever she rolls over hits her left side it wakes her up.  She had persistent pain in the buttocks and into the proximal left thigh none on the right no numbness or tingling that extends down to her legs no associated bowel or bladder symptoms no fever or chills.  Patient is requesting a repeat epidural since the previous one years ago gave her 15 years of relief.  Patient had a prednisone Dosepak 04/09/2021 with temporary improvement and then recurrence of symptoms.  MRI scan showed L3-4 disc protrusion  extending cephalad in the left lateral recess and foramina encroaching on the left L3 root and can also impact the descending left L4 root.  Mild bulge at 4 5 without central or foraminal stenosis.  All other levels were normal.  Read by Dr. Pollyann Kennedy on 12/03/2010.  Review of Systems past problems with carpal tunnel also ankle pain.  Patient is active.  All the systems are noncontributory HPI.   Objective: Vital Signs: There were no vitals taken for this visit.  Physical Exam Constitutional:      Appearance: She is well-developed.  HENT:     Head: Normocephalic.     Right Ear: External ear normal.     Left Ear: External ear normal. There is no impacted cerumen.  Eyes:     Pupils: Pupils are equal, round, and reactive to light.  Neck:     Thyroid: No thyromegaly.     Trachea: No tracheal deviation.  Cardiovascular:     Rate and Rhythm: Normal rate.  Pulmonary:     Effort: Pulmonary effort is normal.  Abdominal:     Palpations: Abdomen is soft.  Musculoskeletal:     Cervical back: No rigidity.  Skin:    General: Skin is warm and dry.  Neurological:     Mental Status: She is alert and oriented to person, place,  and time.  Psychiatric:        Behavior: Behavior normal.     Ortho Exam patient has some sciatic notch tenderness and mild trochanteric bursal tenderness on the left minimal on the right.  She is able to heel and toe walk.  Quads are strong adductor's strong and symmetrical.  No atrophy.  Specialty Comments:  No specialty comments available.  Imaging: No results found.   PMFS History: 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/21/2016   Pain in left ankle and joints of left foot 01/15/2016   CHEST PAIN 11/07/2009   Past Medical History:  Diagnosis Date   GERD (gastroesophageal reflux disease)    Seizure ([place])     Family History  Problem Relation Age  of Onset   Hypertension Father     Past Surgical History:  Procedure Laterality Date   BLADDER DIVERTICULECTOMY     BREAST SURGERY     x2   ELBOW SURGERY     HERNIA REPAIR     NOSE SURGERY     x2   PANCREATECTOMY     Social History   Occupational History   Not on file  Tobacco Use   Smoking status: Never   Smokeless tobacco: Never  Substance and Sexual Activity   Alcohol use: No   Drug use: No   Sexual activity: Not on file

## 2021-10-12 ENCOUNTER — Telehealth: Payer: Self-pay | Admitting: Orthopaedic Surgery

## 2021-10-12 NOTE — Telephone Encounter (Signed)
I called patient and advised. 

## 2021-10-12 NOTE — Telephone Encounter (Signed)
Can you tell me if you have an update on this yet or if we are still awaiting insurance approval? I will be glad to call patient to advise. Thanks.

## 2021-10-12 NOTE — Telephone Encounter (Signed)
Patient called in stating Lorin Mercy wanted her to get an injection in her back no appt has yet to be set, please advise

## 2021-11-03 ENCOUNTER — Encounter: Payer: Self-pay | Admitting: Physical Medicine and Rehabilitation

## 2021-11-03 ENCOUNTER — Ambulatory Visit: Payer: Self-pay

## 2021-11-03 ENCOUNTER — Ambulatory Visit: Payer: No Typology Code available for payment source | Admitting: Physical Medicine and Rehabilitation

## 2021-11-03 VITALS — BP 124/78 | HR 80

## 2021-11-03 DIAGNOSIS — M5416 Radiculopathy, lumbar region: Secondary | ICD-10-CM

## 2021-11-03 MED ORDER — METHYLPREDNISOLONE ACETATE 80 MG/ML IJ SUSP
80.0000 mg | Freq: Once | INTRAMUSCULAR | Status: AC
  Administered 2021-11-03: 80 mg

## 2021-11-03 NOTE — Progress Notes (Signed)
Pt state lower back pain that travels to her left hip. Pt state sitting then having to standing makes the pain worse. Pt state she takes over the counter pain meds to help ease her pain.  Numeric Pain Rating Scale and Functional Assessment Average Pain 5   In the last MONTH (on 0-10 scale) has pain interfered with the following?  1. General activity like being  able to carry out your everyday physical activities such as walking, climbing stairs, carrying groceries, or moving a chair?  Rating(9)   +Driver, -BT, -Dye Allergies.

## 2021-11-03 NOTE — Patient Instructions (Signed)

## 2021-11-23 NOTE — Progress Notes (Signed)
Karina Aguirre - 61 y.o. female MRN [id number]  Date of birth: 20-Jan-1961  Office Visit Note: Visit Date: 11/03/2021 PCP: Asencion Noble, MD Referred by: Asencion Noble, MD  Subjective: Chief Complaint  Patient presents with   Lower Back - Pain   Left Hip - Pain   HPI:  Karina Aguirre is a 61 y.o. female who comes in today at the request of Dr. Rodell Perna for planned Left L3-4 Lumbar Interlaminar epidural steroid injection with fluoroscopic guidance.  The patient has failed conservative care including home exercise, medications, time and activity modification.  This injection will be diagnostic and hopefully therapeutic.  Please see requesting physician notes for further details and justification.  She is having pain more on the left side may be more of the lower lumbar area.  I did see the patient many years ago for epidural injection and she did well.  She has not had new MRI since 2012.  I would recommend updated lumbar spine MRI if the injection is not very successful or does not last very long.   ROS Otherwise per HPI.  Assessment & Plan: Visit Diagnoses:    ICD-10-CM   1. Lumbar radiculopathy  M54.16 XR C-ARM NO REPORT    Epidural Steroid injection    methylPREDNISolone acetate (DEPO-MEDROL) injection 80 mg      Plan: No additional findings.   Meds & Orders:  Meds ordered this encounter  Medications   methylPREDNISolone acetate (DEPO-MEDROL) injection 80 mg    Orders Placed This Encounter  Procedures   XR C-ARM NO REPORT   Epidural Steroid injection    Follow-up: Return for visit to requesting provider as needed.   Procedures: No procedures performed  Lumbar Epidural Steroid Injection - Interlaminar Approach with Fluoroscopic Guidance  Patient: Karina Aguirre      Date of Birth: 10/20/1960 MRN: [id number] PCP: Asencion Noble, MD      Visit Date: 11/03/2021   Universal Protocol:     Consent Given By: the patient  Position: PRONE  Additional Comments: Vital signs were  monitored before and after the procedure. Patient was prepped and draped in the usual sterile fashion. The correct patient, procedure, and site was verified.   Injection Procedure Details:   Procedure diagnoses: Lumbar radiculopathy [M54.16]   Meds Administered:  Meds ordered this encounter  Medications   methylPREDNISolone acetate (DEPO-MEDROL) injection 80 mg     Laterality: Left  Location/Site:  L3-4  Needle: 3.5 in., 20 ga. Tuohy  Needle Placement: Paramedian epidural  Findings:   -Comments: Excellent flow of contrast into the epidural space.  Procedure Details: Using a paramedian approach from the side mentioned above, the region overlying the inferior lamina was localized under fluoroscopic visualization and the soft tissues overlying this structure were infiltrated with 4 ml. of 1% Lidocaine without Epinephrine. The Tuohy needle was inserted into the epidural space using a paramedian approach.   The epidural space was localized using loss of resistance along with counter oblique bi-planar fluoroscopic views.  After negative aspirate for air, blood, and CSF, a 2 ml. volume of Isovue-250 was injected into the epidural space and the flow of contrast was observed. Radiographs were obtained for documentation purposes.    The injectate was administered into the level noted above.   Additional Comments:  The patient tolerated the procedure well Dressing: 2 x 2 sterile gauze and Band-Aid    Post-procedure details: Patient was observed during the procedure. Post-procedure instructions were reviewed.  Patient left  the clinic in stable condition.   Clinical History: No specialty comments available.     Objective:  VS:  HT:    WT:   BMI:     BP:124/78  HR:80bpm  TEMP: ( )  RESP:  Physical Exam Vitals and nursing note reviewed.  Constitutional:      General: She is not in acute distress.    Appearance: Normal appearance. She is not ill-appearing.  HENT:      Head: Normocephalic and atraumatic.     Right Ear: External ear normal.     Left Ear: External ear normal.  Eyes:     Extraocular Movements: Extraocular movements intact.  Cardiovascular:     Rate and Rhythm: Normal rate.     Pulses: Normal pulses.  Pulmonary:     Effort: Pulmonary effort is normal. No respiratory distress.  Abdominal:     General: There is no distension.     Palpations: Abdomen is soft.  Musculoskeletal:        General: Tenderness present.     Cervical back: Neck supple.     Right lower leg: No edema.     Left lower leg: No edema.     Comments: Patient has good distal strength with no pain over the greater trochanters.  No clonus or focal weakness.  Skin:    Findings: No erythema, lesion or rash.  Neurological:     General: No focal deficit present.     Mental Status: She is alert and oriented to person, place, and time.     Sensory: No sensory deficit.     Motor: No weakness or abnormal muscle tone.     Coordination: Coordination normal.  Psychiatric:        Mood and Affect: Mood normal.        Behavior: Behavior normal.      Imaging: No results found.

## 2021-11-23 NOTE — Procedures (Signed)
Lumbar Epidural Steroid Injection - Interlaminar Approach with Fluoroscopic Guidance  Patient: Karina Aguirre      Date of Birth: 04/16/1960 MRN: [id number] PCP: Asencion Noble, MD      Visit Date: 11/03/2021   Universal Protocol:     Consent Given By: the patient  Position: PRONE  Additional Comments: Vital signs were monitored before and after the procedure. Patient was prepped and draped in the usual sterile fashion. The correct patient, procedure, and site was verified.   Injection Procedure Details:   Procedure diagnoses: Lumbar radiculopathy [M54.16]   Meds Administered:  Meds ordered this encounter  Medications   methylPREDNISolone acetate (DEPO-MEDROL) injection 80 mg     Laterality: Left  Location/Site:  L3-4  Needle: 3.5 in., 20 ga. Tuohy  Needle Placement: Paramedian epidural  Findings:   -Comments: Excellent flow of contrast into the epidural space.  Procedure Details: Using a paramedian approach from the side mentioned above, the region overlying the inferior lamina was localized under fluoroscopic visualization and the soft tissues overlying this structure were infiltrated with 4 ml. of 1% Lidocaine without Epinephrine. The Tuohy needle was inserted into the epidural space using a paramedian approach.   The epidural space was localized using loss of resistance along with counter oblique bi-planar fluoroscopic views.  After negative aspirate for air, blood, and CSF, a 2 ml. volume of Isovue-250 was injected into the epidural space and the flow of contrast was observed. Radiographs were obtained for documentation purposes.    The injectate was administered into the level noted above.   Additional Comments:  The patient tolerated the procedure well Dressing: 2 x 2 sterile gauze and Band-Aid    Post-procedure details: Patient was observed during the procedure. Post-procedure instructions were reviewed.  Patient left the clinic in stable condition.

## 2021-11-27 ENCOUNTER — Telehealth: Payer: Self-pay | Admitting: Physical Medicine and Rehabilitation

## 2021-11-27 DIAGNOSIS — M5416 Radiculopathy, lumbar region: Secondary | ICD-10-CM

## 2021-11-27 NOTE — Telephone Encounter (Signed)
Patient called advised the injection worked for 3 weeks but it's starting to hurt again on her left side near the groin area. The number to contact patient is [phone]

## 2021-11-30 NOTE — Telephone Encounter (Signed)
I entered referral and called patient. She states that she is unsure if this is her back or if it is her hip. She and Dr. Ernestina Patches spoke about the pain that she was having prior to her injection and she says that he told her he did not know if the injection would help all of her problems.  Would you rather me leave the referral, but have her come in for an office visit to examine?

## 2021-11-30 NOTE — Telephone Encounter (Signed)
I called patient.  She got 100% relief from injection for about 3 weeks and the pain started coming back next week. The pain is the same as before and in the same place. Please advise.

## 2021-11-30 NOTE — Telephone Encounter (Signed)
Appt made. I called patient and advised.

## 2021-12-03 ENCOUNTER — Ambulatory Visit: Payer: No Typology Code available for payment source | Admitting: Physical Medicine and Rehabilitation

## 2021-12-03 VITALS — BP 137/87 | HR 62

## 2021-12-03 DIAGNOSIS — G8929 Other chronic pain: Secondary | ICD-10-CM

## 2021-12-03 DIAGNOSIS — M5442 Lumbago with sciatica, left side: Secondary | ICD-10-CM | POA: Diagnosis not present

## 2021-12-03 DIAGNOSIS — M5116 Intervertebral disc disorders with radiculopathy, lumbar region: Secondary | ICD-10-CM | POA: Diagnosis not present

## 2021-12-03 DIAGNOSIS — M5416 Radiculopathy, lumbar region: Secondary | ICD-10-CM | POA: Diagnosis not present

## 2021-12-03 DIAGNOSIS — M4726 Other spondylosis with radiculopathy, lumbar region: Secondary | ICD-10-CM

## 2021-12-03 MED ORDER — DIAZEPAM 5 MG PO TABS: ORAL_TABLET | ORAL | 0 refills | Status: DC

## 2021-12-03 NOTE — Progress Notes (Signed)
Karina Aguirre - 61 y.o. female MRN [id number]  Date of birth: 15-Aug-1960  Office Visit Note: Visit Date: 12/03/2021 PCP: Asencion Noble, MD Referred by: Asencion Noble, MD  Subjective: Chief Complaint  Patient presents with   Left Hip - Pain   HPI: Karina Aguirre is a 61 y.o. female who comes in today for evaluation of chronic, worsening and severe left lower back pain radiating to buttock, hip and anterolateral thigh region. Patient initially referred to [place] by Dr. Rodell Perna. Pain ongoing for several years and is exacerbated by standing up and laying on left side. She describes pain as aching and sore sensation, currently rates as 8 out of 10. Patient states she is not able to turn over in bed or sleep on left side due to severe discomfort. Some relief of pain with home exercise regimen, rest and use of medications. Per Dr. Lorin Mercy notes her lumbar MRI imaging from 2012 exhibits disc protrusion at L3-L4 extending in the left lateral recess and foramen encroaching on the left L3 root. Patient had lumbar epidural steroid injection about 15 years ago that she reports helped to alleviate her symptoms significantly, I am unable to locate procedure note in her chart. Patient recently underwent left L3-L4 interlaminar epidural in our office and reports 100% relief of pain for approximately 3 weeks. Of noted Dr. Lorin Mercy did previously evaluate left hip, x-rays from February of this year show normal joint spacing. Patient denies focal weakness, numbness and tingling. Patient denies recent trauma or falls.    Review of Systems  Musculoskeletal:  Positive for back pain.  Neurological:  Negative for tingling, sensory change, focal weakness and weakness.  All other systems reviewed and are negative.  Otherwise per HPI.  Assessment & Plan: Visit Diagnoses:    ICD-10-CM   1. Chronic left-sided low back pain with left-sided sciatica  M54.42    G89.29     2. Lumbar radiculopathy  M54.16 MR LUMBAR SPINE WO CONTRAST     3. Other spondylosis with radiculopathy, lumbar region  M47.26 MR LUMBAR SPINE WO CONTRAST    4. Intervertebral disc disorders with radiculopathy, lumbar region  M51.16 MR LUMBAR SPINE WO CONTRAST       Plan: Findings:  Chronic, worsening and severe left lower back pain radiating to buttock, hip and anterolateral thigh region. Patient continues to have severe pain despite good conservative therapies such as home exercise regimen, rest and use of medications. Patients clinical presentation and exam are consistent with left L3 nerve pattern. Disc protrusion noted at the level of L3-L4 on MRI imaging in 2012. Good relief for approximately 3 weeks with recent lumbar epidural steroid injection. We previously placed order for repeat lumbar epidural steroid injection, we are currently awaiting insurance approval. Next step is to place order for lumbar MRI imaging, we will have her follow up for review once imaging is obtained. Depending on results of lumbar MRI imaging we would proceed with planned injection or look at another approach if we think source of pain is at different level. Patient did voice concerns about anxiety related to MRI procedure, I did place prescription for pre-procedure Valium for her to take on day of imaging. I also feel patient would benefit from formal physical therapy to improve core strength. No red flag symptoms noted upon exam today.     Meds & Orders:  Meds ordered this encounter  Medications   diazepam (VALIUM) 5 MG tablet    Sig: Take one tablet by  mouth with food one hour prior to procedure. May repeat 30 minutes prior if needed.    Dispense:  2 tablet    Refill:  0    Orders Placed This Encounter  Procedures   MR LUMBAR SPINE WO CONTRAST    Follow-up: No follow-ups on file.   Procedures: No procedures performed      Clinical History: No specialty comments available.   She reports that she has never smoked. She has never used smokeless tobacco. No  results for input(s): "HGBA1C", "LABURIC" in the last 8760 hours.  Objective:  VS:  HT:    WT:   BMI:     BP:137/87  HR:62bpm  TEMP: ( )  RESP:97 % Physical Exam Vitals and nursing note reviewed.  HENT:     Head: Normocephalic and atraumatic.     Right Ear: External ear normal.     Left Ear: External ear normal.     Nose: Nose normal.     Mouth/Throat:     Mouth: Mucous membranes are moist.  Eyes:     Extraocular Movements: Extraocular movements intact.  Cardiovascular:     Rate and Rhythm: Normal rate.     Pulses: Normal pulses.  Pulmonary:     Effort: Pulmonary effort is normal.  Abdominal:     General: Abdomen is flat. There is no distension.  Musculoskeletal:        General: Tenderness present.     Cervical back: Normal range of motion.     Comments: Pt is slow to rise from seated position to standing. Good lumbar range of motion. Strong distal strength without clonus, no pain upon palpation of greater trochanters. Dysesthesias noted to left L3 nerve dermatome. Sensation intact bilaterally. Walks independently, gait steady.   Skin:    General: Skin is warm and dry.     Capillary Refill: Capillary refill takes less than 2 seconds.  Neurological:     General: No focal deficit present.     Mental Status: She is alert and oriented to person, place, and time.  Psychiatric:        Mood and Affect: Mood normal.        Behavior: Behavior normal.     Ortho Exam  Imaging: No results found.  Past Medical/Family/Surgical/Social History: Medications & Allergies reviewed per EMR, new medications updated. 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/21/2016   Pain in left ankle and joints of left foot 01/15/2016   CHEST PAIN 11/07/2009   Past Medical History:  Diagnosis Date   GERD (gastroesophageal reflux disease)    Seizure ([place])    Family History  Problem  Relation Age of Onset   Hypertension Father    Past Surgical History:  Procedure Laterality Date   BLADDER DIVERTICULECTOMY     BREAST SURGERY     x2   ELBOW SURGERY     HERNIA REPAIR     NOSE SURGERY     x2   PANCREATECTOMY     Social History   Occupational History   Not on file  Tobacco Use   Smoking status: Never   Smokeless tobacco: Never  Substance and Sexual Activity   Alcohol use: No   Drug use: No   Sexual activity: Not on file

## 2021-12-03 NOTE — Progress Notes (Signed)
Numeric Pain Rating Scale and Functional Assessment Average Pain 3   In the last MONTH (on 0-10 scale) has pain interfered with the following?  1. General activity like being  able to carry out your everyday physical activities such as walking, climbing stairs, carrying groceries, or moving a chair?  Rating(8)   +Driver, -BT, -Dye Allergies.   Left lateral hip pain with burning into the left buttock, only with getting up from seated position, walking and turning over in bed. The pain subsided x 3 weeks, post ESI, but then returned.

## 2021-12-09 ENCOUNTER — Encounter: Payer: Self-pay | Admitting: Physical Medicine and Rehabilitation

## 2021-12-24 ENCOUNTER — Encounter: Payer: Self-pay | Admitting: Orthopaedic Surgery

## 2021-12-24 ENCOUNTER — Ambulatory Visit: Payer: No Typology Code available for payment source | Admitting: Orthopaedic Surgery

## 2021-12-24 DIAGNOSIS — M7062 Trochanteric bursitis, left hip: Secondary | ICD-10-CM | POA: Diagnosis not present

## 2021-12-24 NOTE — Progress Notes (Signed)
Office Visit Note   Patient: Karina Aguirre           Date of Birth: Nov 26, 1960           MRN: [id number] Visit Date: 12/24/2021              Requested by: Asencion Noble, MD [address] [place],  [place] [address] PCP: Asencion Noble, MD   Assessment & Plan: Visit Diagnoses:  1. Trochanteric bursitis, left hip     Plan: Injection performed which she tolerated well and could walk better postinjection follow-up as needed.  Follow-Up Instructions: No follow-ups on file.   Orders:  No orders of the defined types were placed in this encounter.  No orders of the defined types were placed in this encounter.     Procedures: Large Joint Inj: L greater trochanter on 12/25/2021 12:46 PM Details: lateral approach Medications: 1 mL lidocaine 1 %; 2 mL bupivacaine 0.25 %; 40 mg methylPREDNISolone acetate 40 MG/ML      Clinical Data: No additional findings.   Subjective: Chief Complaint  Patient presents with   Left Hip - Pain    HPI 61 year old female returns she has been seen 2 months.  She had an epidural injection which gave her some temporary relief.  She is again having increased problems with pain over left trochanter had previous left trochanteric injection with helped her.  MRI report showed L3-4 lateral disc and foraminal protrusion with lateral recess stenosis.  She has had pain in her buttocks but more pain now laterally over the trochanter.  She has mild disc bulge at L4-5 as well.  She is amatory without any walking aids.  Review of Systems updated unchanged from 10/01/2021 office visit.   Objective: Vital Signs: Ht '5\' 2"'$  (1.575 m)   Wt 157 lb (71.2 kg)   BMI 28.72 kg/m   Physical Exam Constitutional:      Appearance: She is well-developed.  HENT:     Head: Normocephalic.     Right Ear: External ear normal.     Left Ear: External ear normal. There is no impacted cerumen.  Eyes:     Pupils: Pupils are equal, round, and reactive to light.  Neck:      Thyroid: No thyromegaly.     Trachea: No tracheal deviation.  Cardiovascular:     Rate and Rhythm: Normal rate.  Pulmonary:     Effort: Pulmonary effort is normal.  Abdominal:     Palpations: Abdomen is soft.  Musculoskeletal:     Cervical back: No rigidity.  Skin:    General: Skin is warm and dry.  Neurological:     Mental Status: She is alert and oriented to person, place, and time.  Psychiatric:        Behavior: Behavior normal.     Ortho Exam good range of motion of the hips.  Bilateral healed total of arthroplasty incisions.  Exquisite tenderness over the greater trochanter on the left.  Specialty Comments:  No specialty comments available.  Imaging: No results found.   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   Pain in left ankle and joints of left foot 01/15/2016   CHEST PAIN 11/07/2009   Past Medical History:  Diagnosis Date   GERD (gastroesophageal reflux disease)    Seizure ([place])  Family History  Problem Relation Age of Onset   Hypertension Father     Past Surgical History:  Procedure Laterality Date   BLADDER DIVERTICULECTOMY     BREAST SURGERY     x2   ELBOW SURGERY     HERNIA REPAIR     NOSE SURGERY     x2   PANCREATECTOMY     Social History   Occupational History   Not on file  Tobacco Use   Smoking status: Never   Smokeless tobacco: Never  Substance and Sexual Activity   Alcohol use: No   Drug use: No   Sexual activity: Not on file

## 2021-12-25 DIAGNOSIS — M7062 Trochanteric bursitis, left hip: Secondary | ICD-10-CM | POA: Diagnosis not present

## 2021-12-25 MED ORDER — BUPIVACAINE HCL 0.25 % IJ SOLN
2.0000 mL | INTRAMUSCULAR | Status: AC | PRN
  Administered 2021-12-25: 2 mL via INTRA_ARTICULAR

## 2021-12-25 MED ORDER — METHYLPREDNISOLONE ACETATE 40 MG/ML IJ SUSP
40.0000 mg | INTRAMUSCULAR | Status: AC | PRN
  Administered 2021-12-25: 40 mg via INTRA_ARTICULAR

## 2021-12-25 MED ORDER — LIDOCAINE HCL 1 % IJ SOLN
1.0000 mL | INTRAMUSCULAR | Status: AC | PRN
  Administered 2021-12-25: 1 mL

## 2022-02-17 ENCOUNTER — Other Ambulatory Visit: Payer: Self-pay | Admitting: Physician Assistant

## 2022-02-17 ENCOUNTER — Encounter: Payer: Self-pay | Admitting: Physician Assistant

## 2022-02-17 DIAGNOSIS — R1011 Right upper quadrant pain: Secondary | ICD-10-CM

## 2022-02-17 DIAGNOSIS — R1013 Epigastric pain: Secondary | ICD-10-CM

## 2022-02-17 DIAGNOSIS — R1032 Left lower quadrant pain: Secondary | ICD-10-CM

## 2022-02-17 DIAGNOSIS — R112 Nausea with vomiting, unspecified: Secondary | ICD-10-CM

## 2022-02-25 ENCOUNTER — Ambulatory Visit
Admission: RE | Admit: 2022-02-25 | Discharge: 2022-02-25 | Disposition: A | Discharge status: Home / Self Care | Payer: 59 | Source: Ambulatory Visit | Attending: Physician Assistant | Admitting: Physician Assistant

## 2022-02-25 DIAGNOSIS — R1013 Epigastric pain: Secondary | ICD-10-CM

## 2022-02-25 DIAGNOSIS — R1011 Right upper quadrant pain: Secondary | ICD-10-CM

## 2022-02-25 DIAGNOSIS — R112 Nausea with vomiting, unspecified: Secondary | ICD-10-CM

## 2022-02-25 DIAGNOSIS — R1032 Left lower quadrant pain: Secondary | ICD-10-CM

## 2022-02-25 MED ORDER — IOPAMIDOL (ISOVUE-300) INJECTION 61%
100.0000 mL | Freq: Once | INTRAVENOUS | Status: AC | PRN
  Administered 2022-02-25: 100 mL via INTRAVENOUS

## 2022-05-06 ENCOUNTER — Ambulatory Visit: Payer: 59 | Admitting: Orthopaedic Surgery

## 2022-05-26 ENCOUNTER — Ambulatory Visit (INDEPENDENT_AMBULATORY_CARE_PROVIDER_SITE_OTHER): Payer: 59 | Admitting: Orthopaedic Surgery

## 2022-05-26 ENCOUNTER — Other Ambulatory Visit: Payer: Self-pay

## 2022-05-26 ENCOUNTER — Encounter: Payer: Self-pay | Admitting: Orthopaedic Surgery

## 2022-05-26 VITALS — Ht 60.0 in | Wt 154.0 lb

## 2022-05-26 DIAGNOSIS — G8929 Other chronic pain: Secondary | ICD-10-CM

## 2022-05-26 DIAGNOSIS — M25512 Pain in left shoulder: Secondary | ICD-10-CM

## 2022-05-26 DIAGNOSIS — M7542 Impingement syndrome of left shoulder: Secondary | ICD-10-CM | POA: Diagnosis not present

## 2022-05-26 DIAGNOSIS — M5126 Other intervertebral disc displacement, lumbar region: Secondary | ICD-10-CM

## 2022-05-26 MED ORDER — METHYLPREDNISOLONE ACETATE 40 MG/ML IJ SUSP
40.0000 mg | INTRAMUSCULAR | Status: AC | PRN
  Administered 2022-05-26: 40 mg via INTRA_ARTICULAR

## 2022-05-26 MED ORDER — LIDOCAINE HCL 1 % IJ SOLN
0.5000 mL | INTRAMUSCULAR | Status: AC | PRN
  Administered 2022-05-26: .5 mL

## 2022-05-26 MED ORDER — BUPIVACAINE HCL 0.25 % IJ SOLN
4.0000 mL | INTRAMUSCULAR | Status: AC | PRN
  Administered 2022-05-26: 4 mL via INTRA_ARTICULAR

## 2022-05-26 NOTE — Progress Notes (Signed)
Office Visit Note   Patient: Karina Aguirre           Date of Birth: [birth year]           MRN: [id number] Visit Date: 05/26/2022              Requested by: Asencion Noble, MD [address] [place],  [place] [address] PCP: Asencion Noble, MD   Assessment & Plan: Visit Diagnoses:  1. Chronic left shoulder pain   2. Impingement syndrome of left shoulder   3. Protrusion of lumbar intervertebral disc     Plan: Patient wants to proceed with MRI scan she was originally scheduled in imaging scanner and states they want her to pay in her dollars out-of-pocket.  She requested scheduling [facility] for evaluation of back and left leg pain with dorsal foot numbness on the left.  Shoulder injection performed which she tolerated well good relief of pain office follow-up after lumbar MRI.  Patient's lumbar MRI gave her 60% relief for a few weeks and then recurrence of symptoms.  Follow-Up Instructions: No follow-ups on file.   Orders:  Orders Placed This Encounter  Procedures   XR Shoulder Left   No orders of the defined types were placed in this encounter.     Procedures: Large Joint Inj: L subacromial bursa on 05/26/2022 10:02 AM Indications: pain Details: 22 G 1.5 in needle  Arthrogram: No  Medications: 4 mL bupivacaine 0.25 %; 40 mg methylPREDNISolone acetate 40 MG/ML; 0.5 mL lidocaine 1 % Outcome: tolerated well, no immediate complications Procedure, treatment alternatives, risks and benefits explained, specific risks discussed. Consent was given by the patient. Immediately prior to procedure a time out was called to verify the correct patient, procedure, equipment, support staff and site/side marked as required. Patient was prepped and draped in the usual sterile fashion.       Clinical Data: No additional findings.   Subjective: Chief Complaint  Patient presents with   Left Shoulder - Pain   Lower Back - Pain    HPI 62 year old female returns she is having  increased problems with left shoulder impingement.  She has had pain for couple months difficulty cannot get her hand up overhead she is left-hand dominant.  She is also had increased problems with back pain rating down her left leg numbness in her great toe and dorsum of the foot.  When she tried to go to [facility] they wanted her to pay entire cost upfront and she has not had the MRI that had been ordered by Barnet Pall NP.  Patient been treated with lumbar epidurals in the past without sustained relief.  Medications therapy exercise program.  Review of Systems no bowel bladder associated symptoms.  Positive for left shoulder pain decreased shoulder range of motion.   Objective: Vital Signs: Ht 5' (1.524 m)   Wt 154 lb (69.9 kg)   BMI 30.08 kg/m   Physical Exam Constitutional:      Appearance: She is well-developed.  HENT:     Head: Normocephalic.     Right Ear: External ear normal.     Left Ear: External ear normal. There is no impacted cerumen.  Eyes:     Pupils: Pupils are equal, round, and reactive to light.  Neck:     Thyroid: No thyromegaly.     Trachea: No tracheal deviation.  Cardiovascular:     Rate and Rhythm: Normal rate.  Pulmonary:     Effort: Pulmonary effort is normal.  Abdominal:     Palpations: Abdomen is soft.  Musculoskeletal:     Cervical back: No rigidity.  Skin:    General: Skin is warm and dry.  Neurological:     Mental Status: She is alert and oriented to person, place, and time.  Psychiatric:        Behavior: Behavior normal.     Ortho Exam negative Spurling right and left good cervical range of motion positive impingement left shoulder active abduction only 90 degrees with flexion she can get to 120.  Station hand is intact reflexes are intact.  Sciatic notch tenderness on the left trochanteric bursal tenderness.  She can heel and toe walk she has left EHL slight weakness on the left normal on the right.  Gastrocsoleus is strong and  symmetrical.  Specialty Comments:  No specialty comments available.  Imaging: No results found.   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   Pain in left ankle and joints of left foot 01/15/2016   CHEST PAIN 11/07/2009   Past Medical History:  Diagnosis Date   GERD (gastroesophageal reflux disease)    Seizure ([place])     Family History  Problem Relation Age of Onset   Hypertension Father     Past Surgical History:  Procedure Laterality Date   BLADDER DIVERTICULECTOMY     BREAST SURGERY     x2   ELBOW SURGERY     HERNIA REPAIR     NOSE SURGERY     x2   PANCREATECTOMY     Social History   Occupational History   Not on file  Tobacco Use   Smoking status: Never   Smokeless tobacco: Never  Substance and Sexual Activity   Alcohol use: No   Drug use: No   Sexual activity: Not on file

## 2022-06-19 ENCOUNTER — Ambulatory Visit
Admission: RE | Admit: 2022-06-19 | Discharge: 2022-06-19 | Disposition: A | Discharge status: Home / Self Care | Payer: 59 | Source: Ambulatory Visit | Attending: Orthopaedic Surgery | Admitting: Orthopaedic Surgery

## 2022-06-19 DIAGNOSIS — M5126 Other intervertebral disc displacement, lumbar region: Secondary | ICD-10-CM

## 2022-06-22 ENCOUNTER — Telehealth: Payer: Self-pay | Admitting: Orthopaedic Surgery

## 2022-06-22 NOTE — Telephone Encounter (Signed)
I left voicemail advising, MRI report is not available yet. Will check again first thing in the morning and call if I still do not have report. Please keep appointment scheduled for tomorrow.

## 2022-06-22 NOTE — Telephone Encounter (Signed)
Patient states she had her MRI Saturday and wants to make sure it will be available for her appointment in [facility] tomorrow. Please advise

## 2022-06-23 ENCOUNTER — Encounter: Payer: Self-pay | Admitting: Orthopaedic Surgery

## 2022-06-23 ENCOUNTER — Ambulatory Visit (INDEPENDENT_AMBULATORY_CARE_PROVIDER_SITE_OTHER): Payer: 59 | Admitting: Orthopaedic Surgery

## 2022-06-23 VITALS — Ht 60.0 in | Wt 154.0 lb

## 2022-06-23 DIAGNOSIS — M5136 Other intervertebral disc degeneration, lumbar region: Secondary | ICD-10-CM

## 2022-06-23 DIAGNOSIS — M51369 Other intervertebral disc degeneration, lumbar region without mention of lumbar back pain or lower extremity pain: Secondary | ICD-10-CM | POA: Insufficient documentation

## 2022-06-23 NOTE — Progress Notes (Signed)
Office Visit Note   Patient: Karina Aguirre           Date of Birth: May 08, 1960           MRN: [id number] Visit Date: 06/23/2022              Requested by: Carylon Perches, MD [address] [place],  [place] [address] PCP: Carylon Perches, MD   Assessment & Plan: Visit Diagnoses:  1. Other intervertebral disc degeneration, lumbar region     Plan: Will obtain a CBC sed rate CRP at [facility] in [facility] lab.  I will call her with the results.  We discussed the severe degenerative changes with narrowing at L2-3 present on both CT scan she had when she had some abdominal pain as well as the current MRI scan.  She understands she does not need surgery.  Her symptoms should improve over a number of months  Follow-Up Instructions: Return in about 2 months (around 08/23/2022).   Orders:  No orders of the defined types were placed in this encounter.  No orders of the defined types were placed in this encounter.     Procedures: No procedures performed   Clinical Data: No additional findings.   Subjective: Chief Complaint  Patient presents with   Lower Back - Pain, Follow-up    MRI lumbar review    HPI 62 year old female returns with ongoing problems with significant back pain some pain radiates down into her left leg to her foot.  She states at times the pain is so severe she almost has fallen particular with turning and twisting.  No fever or chills.  MRI scan has been obtained available for review that shows advanced degenerative changes at L2-3 level with severe endplate edema eccentric to the left with some dextrocurvature and facet arthropathy.  Review of Systems no associated bowel or bladder symptoms no fever chills other systems 14 point systems noncontributory to HPI.   Objective: Vital Signs: Ht 5' (1.524 m)   Wt 154 lb (69.9 kg)   BMI 30.08 kg/m   Physical Exam Constitutional:      Appearance: She is well-developed.  HENT:     Head: Normocephalic.      Right Ear: External ear normal.     Left Ear: External ear normal. There is no impacted cerumen.  Eyes:     Pupils: Pupils are equal, round, and reactive to light.  Neck:     Thyroid: No thyromegaly.     Trachea: No tracheal deviation.  Cardiovascular:     Rate and Rhythm: Normal rate.  Pulmonary:     Effort: Pulmonary effort is normal.  Abdominal:     Palpations: Abdomen is soft.  Musculoskeletal:     Cervical back: No rigidity.  Skin:    General: Skin is warm and dry.  Neurological:     Mental Status: She is alert and oriented to person, place, and time.  Psychiatric:        Behavior: Behavior normal.     Ortho Exam negative straight leg raising to 90 degrees knee and ankle jerk are intact anterior tib gastrocsoleus EHL peroneals are all strong.  Negative logroll hips.  Specialty Comments:  No specialty comments available.  Imaging: arrative & Impression  CLINICAL DATA:  Low back pain with left L5 numbness.   EXAM: MRI LUMBAR SPINE WITHOUT CONTRAST   TECHNIQUE: Multiplanar, multisequence MR imaging of the lumbar spine was performed. No intravenous contrast was administered.  COMPARISON:  None Available.   FINDINGS: Segmentation: Standard; the lowest formed disc space is designated L5-S1.   Alignment:  There is mild dextrocurvature centered at L2-L3.   Vertebrae: Vertebral body heights are preserved. Background marrow signal is normal. There is prominent T1 hypointensity and edema along the left aspect of the L2-L3 disc space, favored degenerative in nature. There is no endplate irregularity or destruction. There is no other marrow edema. There is no suspicious marrow signal abnormality.   Conus medullaris and cauda equina: Conus extends to the L1 level. Conus and cauda equina appear normal.   Paraspinal and other soft tissues: Unremarkable.   Disc levels:   T12-L1: No significant spinal canal or neural foraminal stenosis   L1-L2: There is a minimal  protrusion without significant spinal canal or neural foraminal stenosis   L2-L3: There is moderate to advanced disc desiccation and narrowing with a diffuse disc bulge slightly eccentric to the left, and mild facet arthropathy resulting in left subarticular narrowing with possible contact of the traversing left L3 nerve root, and mild left and no significant right neural foraminal stenosis.   L3-L4: There is a diffuse disc bulge slightly eccentric to the right and mild facet arthropathy resulting in mild bilateral neural foraminal stenosis without significant spinal canal stenosis   L4-L5: There is a diffuse disc bulge and mild facet arthropathy resulting in mild left and no significant right neural foraminal stenosis and no significant spinal canal stenosis   L5-S1: No significant spinal canal or neural foraminal stenosis.   IMPRESSION: 1. Moderate to advanced disc degeneration at L2-L3 with associated endplate marrow edema, eccentric to the left due to the slight dextrocurvature centered at this level. Findings are favored degenerative in nature and could reflect a source of pain, without endplate irregularity or destruction to suggest infection. Diffuse disc bulge slightly eccentric to the left at this level results in left subarticular narrowing with possible contact of the traversing left L3 nerve root, and mild left neural foraminal stenosis. 2. Overall mild degenerative changes at the other levels as above without other evidence of nerve root impingement. No specific finding to explain the reported left L5 radicular symptoms.     Electronically Signed   By: Lesia Hausen M.D.   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   Pain in left ankle and joints of left foot 01/15/2016   CHEST PAIN 11/07/2009   Past Medical History:  Diagnosis Date   GERD (gastroesophageal reflux disease)    Seizure     Family History  Problem Relation Age of Onset   Hypertension Father     Past Surgical History:  Procedure Laterality Date   BLADDER DIVERTICULECTOMY     BREAST SURGERY     x2   ELBOW SURGERY     HERNIA REPAIR     NOSE SURGERY     x2   PANCREATECTOMY     Social History   Occupational History   Not on file  Tobacco Use   Smoking status: Never   Smokeless tobacco: Never  Substance and Sexual Activity   Alcohol use: No   Drug use: No   Sexual activity: Not on file

## 2022-06-24 ENCOUNTER — Telehealth: Payer: Self-pay | Admitting: Radiology

## 2022-06-24 NOTE — Telephone Encounter (Signed)
I called patient and advised, per Dr. Ophelia Charter, lab results that we have received have come back good. We are still awaiting results for uric acid.

## 2022-06-29 ENCOUNTER — Other Ambulatory Visit (HOSPITAL_COMMUNITY): Payer: Self-pay | Admitting: Internal Medicine

## 2022-06-29 DIAGNOSIS — E785 Hyperlipidemia, unspecified: Secondary | ICD-10-CM

## 2022-07-06 ENCOUNTER — Telehealth: Payer: Self-pay | Admitting: Orthopaedic Surgery

## 2022-07-06 NOTE — Telephone Encounter (Signed)
Per Dr. Ophelia Charter, labwork came back ok. He thought that he had uric acid pending, however, he did not order that test.

## 2022-07-06 NOTE — Telephone Encounter (Signed)
I called patient and advised. 

## 2022-07-06 NOTE — Telephone Encounter (Signed)
Patient advising she never got results from her Lab work

## 2022-07-21 ENCOUNTER — Telehealth: Payer: Self-pay | Admitting: Radiology

## 2022-07-21 NOTE — Telephone Encounter (Signed)
Patient called with increasing left shoulder pain and swelling. Worked in to schedule tomorrow.

## 2022-07-22 ENCOUNTER — Ambulatory Visit (INDEPENDENT_AMBULATORY_CARE_PROVIDER_SITE_OTHER): Payer: 59 | Admitting: Orthopaedic Surgery

## 2022-07-22 ENCOUNTER — Encounter: Payer: Self-pay | Admitting: Orthopaedic Surgery

## 2022-07-22 VITALS — Ht 60.0 in | Wt 154.0 lb

## 2022-07-22 DIAGNOSIS — M7542 Impingement syndrome of left shoulder: Secondary | ICD-10-CM

## 2022-07-22 NOTE — Progress Notes (Signed)
Office Visit Note   Patient: Karina Aguirre           Date of Birth: [birth year]           MRN: [id number] Visit Date: 07/22/2022              Requested by: Carylon Perches, MD [address] [place],  [place] [address] PCP: Carylon Perches, MD   Assessment & Plan: Visit Diagnoses:  1. Impingement syndrome of left shoulder     Plan: Patient sent home exercise program injection and topical cream anti-inflammatories with persistent left shoulder pain with weakness with abduction positive impingement.  Symptoms been present for greater than 3 months.  Will proceed with MRI scan left shoulder rule out rotator cuff tear.  Office follow-up after scan for review.  Follow-Up Instructions: No follow-ups on file.   Orders:  No orders of the defined types were placed in this encounter.  No orders of the defined types were placed in this encounter.     Procedures: No procedures performed   Clinical Data: No additional findings.   Subjective: Chief Complaint  Patient presents with   Left Shoulder - Pain    HPI 62 year old female returns with ongoing left shoulder pain.  She has had pain in the supraspinatus region.  Pain radiates down stops at the elbow.  She used topical cream home exercises.  Previous injection 05/26/2022 did not give her relief.  She has problems washing her hair getting dressed.  No problems reaching overhead with her opposite right arm.  Left arm she can only reach the posterior axillary line.  Passively she can get her arm up overhead.  Previous shoulder radiographs were unremarkable no glenohumeral arthritis.  Review of Systems positive for lumbar disc degeneration.  All other systems are noncontributory.  Negative for rheumatologic conditions.   Objective: Vital Signs: Ht 5' (1.524 m)   Wt 154 lb (69.9 kg)   BMI 30.08 kg/m   Physical Exam Constitutional:      Appearance: She is well-developed.  HENT:     Head: Normocephalic.     Right Ear: External ear  normal.     Left Ear: External ear normal. There is no impacted cerumen.  Eyes:     Pupils: Pupils are equal, round, and reactive to light.  Neck:     Thyroid: No thyromegaly.     Trachea: No tracheal deviation.  Cardiovascular:     Rate and Rhythm: Normal rate.  Pulmonary:     Effort: Pulmonary effort is normal.  Abdominal:     Palpations: Abdomen is soft.  Musculoskeletal:     Cervical back: No rigidity.  Skin:    General: Skin is warm and dry.  Neurological:     Mental Status: She is alert and oriented to person, place, and time.  Psychiatric:        Behavior: Behavior normal.     Ortho Exam positive impingement left shoulder negative right shoulder extreme pain with resisted abduction.  Pain with internal rotation.  Internal rotation only to posterior axillary line with her hand.  Acromioclavicular joint is normal biceps tendon is normal with palpation negative Yergason.  Positive drop arm test left negative right.  Upper extremity reflexes 2+ symmetrical normal heel-toe gait.  Specialty Comments:  No specialty comments available.  Imaging: No results found.   PMFS History: Patient Active Problem List   Diagnosis Date Noted   Other intervertebral disc degeneration, lumbar region 06/23/2022   Impingement syndrome  of left shoulder 05/26/2022   Trochanteric bursitis, left hip 12/25/2021   Protrusion of lumbar intervertebral disc 10/01/2021   Carpal tunnel syndrome of right wrist 06/21/2016   Pain in left ankle and joints of left foot 01/15/2016   CHEST PAIN 11/07/2009   Past Medical History:  Diagnosis Date   GERD (gastroesophageal reflux disease)    Seizure (HCC)     Family History  Problem Relation Age of Onset   Hypertension Father     Past Surgical History:  Procedure Laterality Date   BLADDER DIVERTICULECTOMY     BREAST SURGERY     x2   ELBOW SURGERY     HERNIA REPAIR     NOSE SURGERY     x2   PANCREATECTOMY     Social History   Occupational  History   Not on file  Tobacco Use   Smoking status: Never   Smokeless tobacco: Never  Substance and Sexual Activity   Alcohol use: No   Drug use: No   Sexual activity: Not on file

## 2022-07-22 NOTE — Addendum Note (Signed)
Addended by: Rogers Seeds on: 07/22/2022 10:20 AM   Modules accepted: Orders

## 2022-07-31 ENCOUNTER — Ambulatory Visit
Admission: RE | Admit: 2022-07-31 | Discharge: 2022-07-31 | Disposition: A | Discharge status: Home / Self Care | Payer: 59 | Source: Ambulatory Visit | Attending: Orthopaedic Surgery | Admitting: Orthopaedic Surgery

## 2022-07-31 DIAGNOSIS — M7542 Impingement syndrome of left shoulder: Secondary | ICD-10-CM

## 2022-08-04 ENCOUNTER — Ambulatory Visit (INDEPENDENT_AMBULATORY_CARE_PROVIDER_SITE_OTHER): Payer: 59 | Admitting: Orthopaedic Surgery

## 2022-08-04 ENCOUNTER — Encounter: Payer: Self-pay | Admitting: Orthopaedic Surgery

## 2022-08-04 VITALS — Ht 60.0 in | Wt 154.0 lb

## 2022-08-04 DIAGNOSIS — M7522 Bicipital tendinitis, left shoulder: Secondary | ICD-10-CM | POA: Diagnosis not present

## 2022-08-04 DIAGNOSIS — M75112 Incomplete rotator cuff tear or rupture of left shoulder, not specified as traumatic: Secondary | ICD-10-CM | POA: Diagnosis not present

## 2022-08-04 NOTE — Progress Notes (Signed)
Office Visit Note   Patient: Karina Aguirre           Date of Birth: [birth year]           MRN: [id number] Visit Date: 08/04/2022              Requested by: Carylon Perches, MD [address] [place],  [place] [address] PCP: Carylon Perches, MD   Assessment & Plan: Visit Diagnoses:  1. 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 Tendinitis of upper biceps tendon of left shoulder     Plan: Patient with left shoulder partial rotator cuff tearing some tendinopathy the other tendons but principal involvement is the supraspinatus and also tendinopathy long of the biceps tendon.  Plan to be outpatient shoulder arthroscopy and rotator cuff repair as needed possible biceps tenodesis.  Procedure discussed.  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  Outpatient procedure discussed.  Questions were elicited and answered she understands request we proceed.  Follow-Up Instructions: No follow-ups on file.   Orders:  No orders of the defined types were placed in this encounter.  No orders of the defined types were placed in this encounter.     Procedures: No procedures performed   Clinical Data: No additional findings.   Subjective: Chief Complaint  Patient presents with   Left Shoulder - Pain, Follow-up    MRI review    HPI 62 year old female returns with ongoing greater than 108-month history of progressive left shoulder pain.  She is left-hand dominant she has used ibuprofen and Tylenol without relief.  She is use topical creams been on the home exercise program supervised without improvement.  Pain with abduction outstretched reaching.  Pain wakes her up at night.  MRI scan has been obtained and shows greater than 50% tearing of the supraspinatus with slight supraspinatus atrophy.  Previous injection 05/26/2022 did not give her sustained relief.  Patient states  her shoulder is getting worse and she needs something done to get some relief from the pain since she is having problems walking.  Review of Systems all other systems noncontributory to HPI.  She does have some lumbar disc degeneration, history of carpal tunnel on the right, and also reflux.   Objective: Vital Signs: Ht 5' (1.524 m)   Wt 154 lb (69.9 kg)   BMI 30.08 kg/m   Physical Exam Constitutional:      Appearance: She is well-developed.  HENT:     Head: Normocephalic.     Right Ear: External ear normal.     Left Ear: External ear normal. There is no impacted cerumen.  Eyes:     Pupils: Pupils are equal, round, and reactive to light.  Neck:     Thyroid: No thyromegaly.     Trachea: No tracheal deviation.  Cardiovascular:     Rate and Rhythm: Normal rate.  Pulmonary:     Effort: Pulmonary effort is normal.  Abdominal:     Palpations: Abdomen is soft.  Musculoskeletal:     Cervical back: No rigidity.  Skin:    General: Skin is warm and dry.  Neurological:     Mental Status: She is alert and oriented to person, place, and time.  Psychiatric:        Behavior: Behavior normal.     Ortho Exam no brachial plexus tenderness right  or left negative Spurling.  Positive impingement left.  Pain with resisted supraspinatus testing on the left normal on the right.  Long head of the biceps is moderately tender.  Specialty Comments:  No specialty comments available.  Imaging:  Narrative & Impression  CLINICAL DATA:  Chronic shoulder pain. Rotator cuff disorder suspected.   EXAM: MRI OF THE LEFT SHOULDER WITHOUT CONTRAST   TECHNIQUE: Multiplanar, multisequence MR imaging of the shoulder was performed. No intravenous contrast was administered.   COMPARISON:  None Available.   FINDINGS: Rotator cuff: There is attenuation and fluid bright signal indicating a moderate to high-grade partial-thickness tear of the distal critical zone and entire footprint of the mid and  anterior supraspinatus tendon (coronal series 5 images 12 and 13). This tear continues into the posterior supraspinatus involving the distal critical zone and proximal and mid aspect of the tendon footprint (coronal images 11 and 12). The supraspinatus tear involves greater than 50% of the craniocaudal tendon thickness. There is also a horizontal linear midsubstance tear within the more distal supraspinatus tendon footprint midsubstance measuring up to 2 mm in transverse dimension (coronal series 5, image 12) and 6 mm in AP dimension (sagittal series 6, image 16). No tendon retraction. The infraspinatus is intact. Minimal superior subscapularis intermediate T2 signal with small linear component that may represent a combination of tendinosis and possible tiny chronic tear (sagittal series 6, image 13). The teres minor is intact.   Muscles: Minimal supraspinatus muscle atrophy. Normal muscle signal.   Biceps long head: Moderate intermediate T2 signal tendinosis of the proximal long head of the biceps tendon. Within the proximal bicipital groove. There is linear increased T2 signal suggesting a small interstitial tear (axial images 15 through 17).   Acromioclavicular Joint: There are mild-to-moderate degenerative changes of the acromioclavicular joint including joint space narrowing, subchondral marrow edema, and peripheral osteophytosis. Type II acromion. Mild fluid within the subacromial/subdeltoid bursa.   Glenohumeral Joint: Moderate glenohumeral cartilage thinning.   Labrum: Grossly intact, but evaluation is limited by lack of intraarticular fluid.   Bones: There is a 6 mm cyst within the lesser tuberosity with surrounding marrow edema, adjacent to a course of the long head of biceps tendon and likely secondary to chronic traction changes adjacent to the tendon. No acute fracture.   Other: None.   IMPRESSION: 1. Moderate to high-grade partial-thickness tear of the  distal critical zone and footprint of the supraspinatus tendon. This tear involves greater than 50% of the craniocaudal tendon thickness. There is also a horizontal linear midsubstance tear within the more distal supraspinatus tendon footprint. No tendon retraction. 2. Minimal supraspinatus muscle atrophy. 3. Minimal superior subscapularis tendinosis with possible tiny chronic tear. 4. Moderate proximal long head of the biceps tendinosis with small interstitial tear within the proximal bicipital groove. 5. Mild-to-moderate degenerative changes of the acromioclavicular joint. 6. Mild subacromial/subdeltoid bursitis.     Electronically Signed   By: Neita Garnet M.D.   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 08/04/2022   Tendinitis of upper biceps tendon of left shoulder 08/04/2022   Other intervertebral disc degeneration, lumbar region 06/23/2022   Impingement syndrome of left shoulder 05/26/2022   Trochanteric bursitis, left hip 12/25/2021   Protrusion of lumbar intervertebral disc 10/01/2021   Carpal tunnel syndrome of right wrist 06/21/2016   Pain in left ankle and  joints of left foot 01/15/2016   CHEST PAIN 11/07/2009   Past Medical History:  Diagnosis Date   GERD (gastroesophageal reflux disease)    Seizure (HCC)     Family History  Problem Relation Age of Onset   Hypertension Father     Past Surgical History:  Procedure Laterality Date   BLADDER DIVERTICULECTOMY     BREAST SURGERY     x2   ELBOW SURGERY     HERNIA REPAIR     NOSE SURGERY     x2   PANCREATECTOMY     Social History   Occupational History   Not on file  Tobacco Use   Smoking status: Never   Smokeless tobacco: Never  Substance and Sexual Activity   Alcohol use: No   Drug use: No   Sexual activity: Not on file

## 2022-08-06 ENCOUNTER — Ambulatory Visit (HOSPITAL_COMMUNITY)
Admission: RE | Admit: 2022-08-06 | Discharge: 2022-08-06 | Disposition: A | Discharge status: Home / Self Care | Payer: Self-pay | Source: Ambulatory Visit | Attending: Internal Medicine | Admitting: Internal Medicine

## 2022-08-06 DIAGNOSIS — E785 Hyperlipidemia, unspecified: Secondary | ICD-10-CM | POA: Insufficient documentation

## 2022-08-18 ENCOUNTER — Ambulatory Visit: Payer: 59 | Admitting: Orthopaedic Surgery

## 2022-08-26 ENCOUNTER — Telehealth: Payer: Self-pay | Admitting: Radiology

## 2022-08-26 NOTE — Telephone Encounter (Signed)
Patient called [facility] office and states that she has not heard anything from surgical center in regards to the surgery that she is having. She would also like to know if insurance has approved the surgery. Lastly, she wants to be sure it is noted that she cannot take oxycodone and that Dr. Ophelia Charter will have to give her something else.   Sherrie-can you let me know when patients routinely get call from surgical center? She has all of the information that you gave her regarding times.  Amy-can you please let me know about insurance approval? I told her it looked like it was pending in chart and had been sent to [facility] for authorization.   Dr. Rudy Jew does NOT want Oxycodone. Thanks.

## 2022-08-30 NOTE — Telephone Encounter (Signed)
I spoke with patient and advised.  She states that she has now developed a "knot" about mid forearm. There has not been a new injury. The initial pain that she was having is now traveling further down her arm and into her hand. She wanted you to be aware prior to surgery.

## 2022-08-31 ENCOUNTER — Telehealth: Payer: Self-pay | Admitting: Radiology

## 2022-08-31 NOTE — Telephone Encounter (Signed)
   Patient was told by surgical center that she had to come in and have you look at the knot on her arm prior to her coming in for shoulder surgery on Monday. I have asked patient to come to [facility] office in the morning at 0845.  Would you like for me to make her an appt or just have her come in to take a look?

## 2022-09-06 ENCOUNTER — Other Ambulatory Visit: Payer: Self-pay | Admitting: Orthopaedic Surgery

## 2022-09-06 DIAGNOSIS — M75112 Incomplete rotator cuff tear or rupture of left shoulder, not specified as traumatic: Secondary | ICD-10-CM

## 2022-09-06 DIAGNOSIS — M7542 Impingement syndrome of left shoulder: Secondary | ICD-10-CM

## 2022-09-06 DIAGNOSIS — M7522 Bicipital tendinitis, left shoulder: Secondary | ICD-10-CM

## 2022-09-06 MED ORDER — HYDROCODONE-ACETAMINOPHEN 5-325 MG PO TABS: 1.0000 | ORAL_TABLET | Freq: Four times a day (QID) | ORAL | 0 refills | Status: DC | PRN

## 2022-09-06 MED ORDER — ONDANSETRON HCL 4 MG PO TABS: 4.0000 mg | ORAL_TABLET | Freq: Three times a day (TID) | ORAL | 0 refills | Status: DC | PRN

## 2022-09-15 ENCOUNTER — Ambulatory Visit (INDEPENDENT_AMBULATORY_CARE_PROVIDER_SITE_OTHER): Payer: 59 | Admitting: Orthopaedic Surgery

## 2022-09-15 ENCOUNTER — Encounter: Payer: Self-pay | Admitting: Orthopaedic Surgery

## 2022-09-15 VITALS — Ht 60.0 in | Wt 154.0 lb

## 2022-09-15 DIAGNOSIS — M7522 Bicipital tendinitis, left shoulder: Secondary | ICD-10-CM

## 2022-09-15 DIAGNOSIS — M75112 Incomplete rotator cuff tear or rupture of left shoulder, not specified as traumatic: Secondary | ICD-10-CM

## 2022-09-15 NOTE — Progress Notes (Signed)
   Post-Op Visit Note   Patient: Karina Aguirre           Date of Birth: Jul 13, 1960           MRN: [id number] Visit Date: 09/15/2022 PCP: Carylon Perches, MD   Assessment & Plan: Arthroscopic sutures removed.  She had biceps tenodesis rotator cuff repair.  Incision looks good.  Pain medication renewed.  She will work on elephant swings, tiny circles, recheck 2 weeks.  Chief Complaint:  Chief Complaint  Patient presents with   Left Shoulder - Routine Post Op    09/06/2022 left shoulder arthroscopy, deb, BT, RCTR   Visit Diagnoses:  1. 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 Tendinitis of upper biceps tendon of left shoulder     Plan: Pain medication renewed recheck 2 weeks.  Follow-Up Instructions: Return in about 2 weeks (around 09/29/2022).   Orders:  No orders of the defined types were placed in this encounter.  No orders of the defined types were placed in this encounter.   Imaging: No results found.  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 08/04/2022   Tendinitis of upper biceps tendon of left shoulder 08/04/2022   Other intervertebral disc degeneration, lumbar region 06/23/2022   Impingement syndrome of left shoulder 05/26/2022   Trochanteric bursitis, left hip 12/25/2021   Protrusion of lumbar intervertebral disc 10/01/2021   Carpal tunnel syndrome of right wrist 06/21/2016   Pain in left ankle and joints of left foot 01/15/2016   CHEST PAIN 11/07/2009   Past Medical History:  Diagnosis Date   GERD (gastroesophageal reflux disease)    Seizure (HCC)     Family History  Problem Relation Age of Onset   Hypertension Father     Past Surgical History:  Procedure Laterality Date   BLADDER DIVERTICULECTOMY     BREAST SURGERY     x2   ELBOW SURGERY     HERNIA REPAIR     NOSE SURGERY     x2   PANCREATECTOMY     Social History   Occupational History   Not on file  Tobacco Use    Smoking status: Never   Smokeless tobacco: Never  Substance and Sexual Activity   Alcohol use: No   Drug use: No   Sexual activity: Not on file

## 2022-09-29 ENCOUNTER — Encounter: Payer: Self-pay | Admitting: Orthopaedic Surgery

## 2022-09-29 ENCOUNTER — Ambulatory Visit (INDEPENDENT_AMBULATORY_CARE_PROVIDER_SITE_OTHER): Payer: 59 | Admitting: Orthopaedic Surgery

## 2022-09-29 VITALS — Ht 60.0 in | Wt 154.0 lb

## 2022-09-29 DIAGNOSIS — M7522 Bicipital tendinitis, left shoulder: Secondary | ICD-10-CM

## 2022-09-29 DIAGNOSIS — M75112 Incomplete rotator cuff tear or rupture of left shoulder, not specified as traumatic: Secondary | ICD-10-CM

## 2022-09-29 NOTE — Progress Notes (Signed)
   Post-Op Visit Note   Patient: Karina Aguirre           Date of Birth: 12/25/1960           MRN: [id number] Visit Date: 09/29/2022 PCP: Carylon Perches, MD   Assessment & Plan: Follow-up biceps tenodesis rotator cuff repair incision looks good.  She is 2 weeks out.  Has not will fix up pulley at home she can work on passive range of motion.  Physical therapy starting in [place] in 4 weeks when she will be 6 weeks postop for gradual strengthening post rotator cuff repair.  Work slip given no work until 10/12/2022.  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  I will recheck her in September.  Chief Complaint:  Chief Complaint  Patient presents with   Left Shoulder - Routine Post Op, Follow-up    09/06/2022 left shoulder arthroscopy, deb, BT, RCTR   Visit Diagnoses:  1. Tendinitis of upper biceps tendon of left shoulder   2. Partial nontraumatic tear of left rotator cuff     Plan: Recheck in September.  Work resumption as above.  If they will not allow one-handed work she will have to stay out until I see her back in September.  Follow-Up Instructions: Return in about 7 weeks (around 11/17/2022).   Orders:  Orders Placed This Encounter  Procedures   Ambulatory referral to Physical Therapy   No orders of the defined types were placed in this encounter.   Imaging: No results found.  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 08/04/2022   Tendinitis of upper biceps tendon of left shoulder 08/04/2022   Other intervertebral disc degeneration, lumbar region 06/23/2022   Impingement syndrome of left shoulder 05/26/2022   Trochanteric bursitis, left hip 12/25/2021   Protrusion of lumbar intervertebral disc 10/01/2021   Carpal tunnel syndrome of right wrist 06/21/2016   Pain in left ankle and joints of left foot 01/15/2016   CHEST PAIN 11/07/2009   Past Medical History:   Diagnosis Date   GERD (gastroesophageal reflux disease)    Seizure (HCC)     Family History  Problem Relation Age of Onset   Hypertension Father     Past Surgical History:  Procedure Laterality Date   BLADDER DIVERTICULECTOMY     BREAST SURGERY     x2   ELBOW SURGERY     HERNIA REPAIR     NOSE SURGERY     x2   PANCREATECTOMY     Social History   Occupational History   Not on file  Tobacco Use   Smoking status: Never   Smokeless tobacco: Never  Substance and Sexual Activity   Alcohol use: No   Drug use: No   Sexual activity: Not on file

## 2022-10-15 ENCOUNTER — Telehealth: Payer: Self-pay | Admitting: Orthopaedic Surgery

## 2022-10-15 NOTE — Telephone Encounter (Signed)
I called back, she was calling about her husband. Message placed in her chart

## 2022-10-15 NOTE — Telephone Encounter (Signed)
Patient called. Would like Betsy to call her. She has some questions about some things. Her cb # [phone]

## 2022-10-25 ENCOUNTER — Ambulatory Visit: Payer: 59 | Admitting: Physical Therapy

## 2022-10-26 ENCOUNTER — Encounter: Payer: Self-pay | Admitting: Physical Therapy

## 2022-10-26 ENCOUNTER — Ambulatory Visit: Payer: 59 | Attending: Orthopaedic Surgery | Admitting: Physical Therapy

## 2022-10-26 ENCOUNTER — Other Ambulatory Visit: Payer: Self-pay

## 2022-10-26 DIAGNOSIS — M25512 Pain in left shoulder: Secondary | ICD-10-CM | POA: Diagnosis present

## 2022-10-26 DIAGNOSIS — M7522 Bicipital tendinitis, left shoulder: Secondary | ICD-10-CM | POA: Insufficient documentation

## 2022-10-26 DIAGNOSIS — M25612 Stiffness of left shoulder, not elsewhere classified: Secondary | ICD-10-CM | POA: Insufficient documentation

## 2022-10-26 DIAGNOSIS — G8929 Other chronic pain: Secondary | ICD-10-CM | POA: Insufficient documentation

## 2022-10-26 DIAGNOSIS — M75112 Incomplete rotator cuff tear or rupture of left shoulder, not specified as traumatic: Secondary | ICD-10-CM | POA: Diagnosis not present

## 2022-10-26 NOTE — Therapy (Signed)
OUTPATIENT PHYSICAL THERAPY SHOULDER EVALUATION   Patient Name: Karina Aguirre MRN: [id number] DOB:May 15, 1960, 62 y.o., female Today's Date: 10/26/2022  END OF SESSION:  PT End of Session - 10/26/22 1636     Visit Number 1    Number of Visits 12    Date for PT Re-Evaluation 12/07/22    Authorization Type FOTO    PT Start Time 0352    PT Stop Time 0438    PT Time Calculation (min) 46 min    Activity Tolerance Patient tolerated treatment well    Behavior During Therapy WFL for tasks assessed/performed             Past Medical History:  Diagnosis Date   GERD (gastroesophageal reflux disease)    Seizure (HCC)    Past Surgical History:  Procedure Laterality Date   BLADDER DIVERTICULECTOMY     BREAST SURGERY     x2   ELBOW SURGERY     HERNIA REPAIR     NOSE SURGERY     x2   PANCREATECTOMY     Patient Active Problem List   Diagnosis Date Noted   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  THERAPY DIAG:  Chronic left shoulder pain  Stiffness of left shoulder, not elsewhere classified  Rationale for Evaluation and Treatment: Rehabilitation  ONSET DATE: 09/06/22 (surgery date).  SUBJECTIVE:                                                                                                                                                                                      SUBJECTIVE STATEMENT: The patient presents to the clinic s/p left  shoulder RTC repair and Biceps tenodesis performed on 09/06/22.  She remains in a sling at this time.  She has been performing pendulums and has been using a pulley system at home.  She is not having pain at rest.  Pain will increase with movement.  PERTINENT HISTORY: See above.  PAIN:  Are you having pain? No  PRECAUTIONS: Other: Begin with PROM, PAROM to patient's left shoulder.   AAROM.   WEIGHT BEARING RESTRICTIONS:  No left UE weight bearing.  FALLS:  Has patient fallen in last 6 months? 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  NEXT MD VISIT:   OBJECTIVE:   PATIENT SURVEYS:  FOTO .   UPPER EXTREMITY ROM:   In supine:  Gentle left shoulder PROM into flexion is 75 degrees, ER is -20 degrees from neutral and abduction 40 degrees. PALPATION:  Incisional sites appear to be healing very well.  Minimal palpable left shoulder tenderness.  CC is pain into her middle deltoid and occasions to elbow which appear to be referred in nature.   TODAY'S TREATMENT:                                                                                                                                         DATE: Vasopneumatic on low to patient's left shoulder with pillow between thorax and elbow.   PATIENT EDUCATION: Education details: See below. Person educated: Patient Education method: Explanation, Demonstration, Tactile cues, Verbal cues, and Handouts Education comprehension: verbalized understanding, returned demonstration, verbal cues required, and tactile cues required  HOME EXERCISE PROGRAM:  HOME EXERCISE PROGRAM Created by [place] Ibraheem Voris Aug 27th, 2024 View at [email or web address]-[email or web address] using code: [email or web address]  Page 1 of 1 2 Exercises WAND EXTERNAL ROTATION - SUPINE ER Lie on your back holding a cane or wand with both hands.  On the  affected side, place a small rolled up towel or pillow under your elbow. Maintain approx. 90 degree bend at the elbow with your arm approximately 30-45 degrees away from your side. GENTLE. PAIN-FREE Use your other arm to pull the wand/cane to rotate the affected arm back into a stretch. Hold and then return to starting position and then repeat. Repeat 10 Times Hold 15 Seconds Complete 1 Set Perform 4 Times a Day Closed chain flexion stretch Step 1: Stand facing table/countertop with both hands on table/countertop. Step 2: Keep hands on table/countertop and step back away from table/countertop to stretch the shoulders. DO NOT WEIGHT BEAR THROUGH AFFECTED UPPER EXTREMITY. GENTLE AND PAIN-FREE. Repeat 10 Times Hold 10 Seconds Complete 1 Set Perform 4 Times a Day  ASSESSMENT:  CLINICAL IMPRESSION: The patient presents to [facility] s/p left shoulder RTC repair and biceps tenodesis performed on 09/06/22.  She remains in a sling at this time.  She has a significant loss of range of motion at this time.  She was provided with two additional stretches to begin performing at home.  She has been doing the pendulum exercise and has been using a pulley system at home.  She c/o pain into her left middle deltoid region and on occasions to her elbow which appears to be referred in nature.  Her incisional sites appear to be healing very well.  Patient will benefit from skilled physical therapy intervention to address  pain and deficits.  OBJECTIVE IMPAIRMENTS: decreased activity tolerance, decreased ROM, and pain.   ACTIVITY LIMITATIONS: carrying, lifting, and reach over head  PARTICIPATION LIMITATIONS: meal prep, cleaning, and laundry  PERSONAL FACTORS: Time since onset of injury/illness/exacerbation are also affecting patient's functional outcome.   REHAB POTENTIAL: Good  CLINICAL DECISION MAKING: Stable/uncomplicated  EVALUATION COMPLEXITY: Low   GOALS:  SHORT TERM GOALS: Target date:  11/09/22  Ind with an initial HEP. Goal status: INITIAL   LONG TERM GOALS: Target date: 12/07/22  Ind with an advanced HEP.  Goal status: INITIAL  2.  Active left shoulder flexion to 145 degrees so the patient can easily reach overhead.  Goal status: INITIAL  3.  Active ER to 70 degrees+ to allow for easily donning/doffing of apparel.  Goal status: INITIAL  4.  Increase ROM so patient is able to reach behind back to L3.  Goal status: INITIAL  5.  Increase left shoulder strength to a solid 4+/5 to increase stability for performance of functional activities.  Goal status: INITIAL  6.  Perform ADL's with pain not > 3/10.  Goal status: INITIAL  PLAN:  PT FREQUENCY: 2x/week  PT DURATION: 6 weeks  PLANNED INTERVENTIONS: Therapeutic exercises, Therapeutic activity, Neuromuscular re-education, Patient/Family education, Self Care, Electrical stimulation, Cryotherapy, Moist heat, Vasopneumatic device, Ultrasound, and Manual therapy  PLAN FOR NEXT SESSION: P and PAROM, review HEP, Pulleys, wall ladder UE Ranger.  Vasopneumatic with pillow between left elbow and thorax.   Xenia Nile, [place], PT 10/26/2022, 5:29 PM

## 2022-10-28 ENCOUNTER — Ambulatory Visit: Payer: 59 | Admitting: Physical Therapy

## 2022-10-28 DIAGNOSIS — G8929 Other chronic pain: Secondary | ICD-10-CM

## 2022-10-28 DIAGNOSIS — M25512 Pain in left shoulder: Secondary | ICD-10-CM | POA: Diagnosis not present

## 2022-10-28 DIAGNOSIS — M25612 Stiffness of left shoulder, not elsewhere classified: Secondary | ICD-10-CM

## 2022-10-28 NOTE — Therapy (Signed)
OUTPATIENT PHYSICAL THERAPY SHOULDER EVALUATION   Patient Name: Karina Aguirre MRN: [id number] DOB:07/15/1960, 62 y.o., female Today's Date: 10/28/2022  END OF SESSION:  PT End of Session - 10/28/22 1709     Visit Number 2    Number of Visits 12    Date for PT Re-Evaluation 12/07/22    Authorization Type FOTO    PT Start Time 0346    PT Stop Time 0431    PT Time Calculation (min) 45 min    Activity Tolerance Patient tolerated treatment well    Behavior During Therapy WFL for tasks assessed/performed             Past Medical History:  Diagnosis Date   GERD (gastroesophageal reflux disease)    Seizure (HCC)    Past Surgical History:  Procedure Laterality Date   BLADDER DIVERTICULECTOMY     BREAST SURGERY     x2   ELBOW SURGERY     HERNIA REPAIR     NOSE SURGERY     x2   PANCREATECTOMY     Patient Active Problem List   Diagnosis Date Noted   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  THERAPY DIAG:  Chronic left shoulder pain  Stiffness of left shoulder, not elsewhere classified  Rationale for Evaluation and Treatment: Rehabilitation  ONSET DATE: 09/06/22 (surgery date).  SUBJECTIVE:                                                                                                                                                                                      SUBJECTIVE STATEMENT: No new complaints.  PERTINENT HISTORY: See  above.  PAIN:  Are you having pain? No  PRECAUTIONS: Other: Begin with PROM, PAROM to patient's left shoulder.   AAROM.   WEIGHT BEARING RESTRICTIONS:  No left UE weight bearing.  FALLS:  Has patient fallen in last 6 months? 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  NEXT MD VISIT:   OBJECTIVE:   PATIENT SURVEYS:  FOTO .  UPPER EXTREMITY ROM:   In supine:  Gentle left shoulder PROM into flexion is 75 degrees, ER is -20 degrees from neutral and abduction 40 degrees. PALPATION:  Incisional sites appear to be healing very well.  Minimal palpable left shoulder tenderness.  CC is pain into her middle deltoid and occasions to elbow which appear to be referred in nature.   TODAY'S TREATMENT:                                                                                                                                         DATE: Reviewed HEP and provided patient with a handout an over the door pulley system.  Performed gentle PROM x 23 minutes to patient's left shoulder (low load long duration stretching) f/b Vasopneumatic on low to patient's left shoulder with pillow between thorax and elbow.   PATIENT EDUCATION: Education details: See below. Person educated: Patient Education method: Explanation, Demonstration, Tactile cues, Verbal cues, and Handouts Education comprehension: verbalized understanding, returned demonstration, verbal cues required, and tactile cues required  HOME EXERCISE PROGRAM:  HOME EXERCISE PROGRAM Created by [place] Kenli Waldo Aug 27th, 2024 View at [email or web address] using code: [email or web address]  Page 1 of 1 2 Exercises WAND EXTERNAL ROTATION - SUPINE ER Lie on your back holding a cane or wand with both hands.  On the affected side, place a small rolled up towel or pillow under your elbow. Maintain approx.  90 degree bend at the elbow with your arm approximately 30-45 degrees away from your side. GENTLE. PAIN-FREE Use your other arm to pull the wand/cane to rotate the affected arm back into a stretch. Hold and then return to starting position and then repeat. Repeat 10 Times Hold 15 Seconds Complete 1 Set Perform 4 Times a Day Closed chain flexion stretch Step 1: Stand facing table/countertop with both hands on table/countertop. Step 2: Keep hands on table/countertop and step back away from table/countertop to stretch the shoulders. DO NOT WEIGHT BEAR THROUGH AFFECTED UPPER EXTREMITY. GENTLE AND PAIN-FREE. Repeat 10 Times Hold 10 Seconds Complete 1 Set Perform 4 Times a Day  ASSESSMENT:  CLINICAL IMPRESSION: Patient did well with treatment today.  Reviewed HEP and had her try our over the door pulley system which she liked a lot.  Provided her with a handout to obtain one.   OBJECTIVE IMPAIRMENTS: decreased activity tolerance, decreased ROM, and pain.   ACTIVITY LIMITATIONS: carrying, lifting, and reach over head  PARTICIPATION LIMITATIONS: meal prep, cleaning, and laundry  PERSONAL FACTORS: Time since onset of injury/illness/exacerbation are also affecting patient's functional outcome.   REHAB POTENTIAL: Good  CLINICAL DECISION MAKING: Stable/uncomplicated  EVALUATION COMPLEXITY: Low   GOALS:  SHORT TERM GOALS: Target date: 11/09/22  Ind with an initial HEP. Goal status: INITIAL   LONG TERM GOALS: Target date: 12/07/22  Ind with an advanced HEP.  Goal status: INITIAL  2.  Active left shoulder flexion to  145 degrees so the patient can easily reach overhead.  Goal status: INITIAL  3.  Active ER to 70 degrees+ to allow for easily donning/doffing of apparel.  Goal status: INITIAL  4.  Increase ROM so patient is able to reach behind back to L3.  Goal status: INITIAL  5.  Increase left shoulder strength to a solid 4+/5 to increase stability for performance of  functional activities.  Goal status: INITIAL  6.  Perform ADL's with pain not > 3/10.  Goal status: INITIAL  PLAN:  PT FREQUENCY: 2x/week  PT DURATION: 6 weeks  PLANNED INTERVENTIONS: Therapeutic exercises, Therapeutic activity, Neuromuscular re-education, Patient/Family education, Self Care, Electrical stimulation, Cryotherapy, Moist heat, Vasopneumatic device, Ultrasound, and Manual therapy  PLAN FOR NEXT SESSION: P and PAROM, review HEP, Pulleys, wall ladder UE Ranger.  Vasopneumatic with pillow between left elbow and thorax.   Kaleea Penner, [place], PT 10/28/2022, 5:10 PM

## 2022-11-02 ENCOUNTER — Ambulatory Visit: Payer: 59 | Attending: Orthopaedic Surgery | Admitting: Physical Therapy

## 2022-11-02 DIAGNOSIS — M25512 Pain in left shoulder: Secondary | ICD-10-CM | POA: Insufficient documentation

## 2022-11-02 DIAGNOSIS — G8929 Other chronic pain: Secondary | ICD-10-CM | POA: Diagnosis present

## 2022-11-02 DIAGNOSIS — M25612 Stiffness of left shoulder, not elsewhere classified: Secondary | ICD-10-CM | POA: Diagnosis present

## 2022-11-02 NOTE — Therapy (Signed)
OUTPATIENT PHYSICAL THERAPY SHOULDER EVALUATION   Patient Name: Karina Aguirre MRN: [id number] DOB:Feb 13, 1961, 62 y.o., female Today's Date: 11/02/2022  END OF SESSION:  PT End of Session - 11/02/22 1637     Visit Number 3    Number of Visits 12    Date for PT Re-Evaluation 12/07/22    Authorization Type FOTO    PT Start Time 0400    PT Stop Time 0449    PT Time Calculation (min) 49 min    Activity Tolerance Patient tolerated treatment well    Behavior During Therapy WFL for tasks assessed/performed             Past Medical History:  Diagnosis Date   GERD (gastroesophageal reflux disease)    Seizure (HCC)    Past Surgical History:  Procedure Laterality Date   BLADDER DIVERTICULECTOMY     BREAST SURGERY     x2   ELBOW SURGERY     HERNIA REPAIR     NOSE SURGERY     x2   PANCREATECTOMY     Patient Active Problem List   Diagnosis Date Noted   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  THERAPY DIAG:  Chronic left shoulder pain  Stiffness of left shoulder, not elsewhere classified  Rationale for Evaluation and Treatment: Rehabilitation  ONSET DATE: 09/06/22 (surgery date).  SUBJECTIVE:                                                                                                                                                                                      SUBJECTIVE STATEMENT: Doing well.  Been doing her HEP and ordered an  over the door pulley.  PERTINENT HISTORY: See above.  PAIN:  Are you having pain? No  PRECAUTIONS: Other: Begin with PROM, PAROM to patient's left shoulder.   AAROM.   WEIGHT BEARING RESTRICTIONS:  No left UE weight bearing.  FALLS:  Has patient fallen in last 6 months? 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  NEXT  MD VISIT:   OBJECTIVE:   PATIENT SURVEYS:  FOTO .   UPPER EXTREMITY ROM:   In supine:  Gentle left shoulder PROM into flexion is 75 degrees, ER is -20 degrees from neutral and abduction 40 degrees. PALPATION:  Incisional sites appear to be healing very well.  Minimal palpable left shoulder tenderness.  CC is pain into her middle deltoid and occasions to elbow which appear to be referred in nature.   TODAY'S TREATMENT:                                                                                                                                         DATE: 11/02/22:                                    EXERCISE LOG  Exercise Repetitions and Resistance Comments  Pulleys 5 minutes.   Seated UE Ranger 5 minutes.   Wall ladder 5 minutes (to 22).           In supine:  PROM x 8 minutes to patient's left shoulder f/b vasopneumatic on low with pillow between elbow and thorax x 15 minutes on low.     PATIENT EDUCATION: Education details: See below. Person educated: Patient Education method: Explanation, Demonstration, Tactile cues, Verbal cues, and Handouts Education comprehension: verbalized understanding, returned demonstration, verbal cues required, and tactile cues required  HOME EXERCISE PROGRAM:  HOME EXERCISE PROGRAM Created by [place] Rashan Rounsaville Aug 27th, 2024 View at [email or web address]-exercise-[email or web address] using code: [email or web address]  Page 1 of 1 2 Exercises WAND EXTERNAL ROTATION - SUPINE ER Lie on your back holding a cane  or wand with both hands.  On the affected side, place a small rolled up towel or pillow under your elbow. Maintain approx. 90 degree bend at the elbow with your arm approximately 30-45 degrees away from your side. GENTLE. PAIN-FREE Use your other arm to pull the wand/cane to rotate the affected arm back into a stretch. Hold and then return to starting position and then repeat. Repeat 10 Times Hold 15 Seconds Complete 1 Set Perform 4 Times a Day Closed chain flexion stretch Step 1: Stand facing table/countertop with both hands on table/countertop. Step 2: Keep hands on table/countertop and step back away from table/countertop to stretch the shoulders. DO NOT WEIGHT BEAR THROUGH AFFECTED UPPER EXTREMITY. GENTLE AND PAIN-FREE. Repeat 10 Times Hold 10 Seconds Complete 1 Set Perform 4 Times a Day  ASSESSMENT:  CLINICAL IMPRESSION: Patient did very well with new interventions of wall ladder and seated UE ranger.  Her left shoulder ER has improved nicely.    OBJECTIVE IMPAIRMENTS: decreased activity tolerance, decreased ROM, and pain.   ACTIVITY LIMITATIONS: carrying, lifting, and reach over head  PARTICIPATION LIMITATIONS: meal prep, cleaning, and laundry  PERSONAL FACTORS: Time since onset of injury/illness/exacerbation are also affecting patient's  functional outcome.   REHAB POTENTIAL: Good  CLINICAL DECISION MAKING: Stable/uncomplicated  EVALUATION COMPLEXITY: Low   GOALS:  SHORT TERM GOALS: Target date: 11/09/22  Ind with an initial HEP. Goal status: INITIAL   LONG TERM GOALS: Target date: 12/07/22  Ind with an advanced HEP.  Goal status: INITIAL  2.  Active left shoulder flexion to 145 degrees so the patient can easily reach overhead.  Goal status: INITIAL  3.  Active ER to 70 degrees+ to allow for easily donning/doffing of apparel.  Goal status: INITIAL  4.  Increase ROM so patient is able to reach behind back to L3.  Goal status: INITIAL  5.  Increase left  shoulder strength to a solid 4+/5 to increase stability for performance of functional activities.  Goal status: INITIAL  6.  Perform ADL's with pain not > 3/10.  Goal status: INITIAL  PLAN:  PT FREQUENCY: 2x/week  PT DURATION: 6 weeks  PLANNED INTERVENTIONS: Therapeutic exercises, Therapeutic activity, Neuromuscular re-education, Patient/Family education, Self Care, Electrical stimulation, Cryotherapy, Moist heat, Vasopneumatic device, Ultrasound, and Manual therapy  PLAN FOR NEXT SESSION: P and PAROM, review HEP, Pulleys, wall ladder UE Ranger.  Vasopneumatic with pillow between left elbow and thorax.   Elyn Krogh, [place], PT 11/02/2022, 4:55 PM

## 2022-11-03 ENCOUNTER — Encounter: Payer: Self-pay | Admitting: Orthopaedic Surgery

## 2022-11-03 ENCOUNTER — Ambulatory Visit: Payer: 59 | Admitting: Orthopaedic Surgery

## 2022-11-03 DIAGNOSIS — M7542 Impingement syndrome of left shoulder: Secondary | ICD-10-CM

## 2022-11-03 NOTE — Progress Notes (Signed)
Post-Op Visit Note   Patient: Karina Aguirre           Date of Birth: [birth year]           MRN: [id number] Visit Date: 11/03/2022 PCP: Carylon Perches, MD   Assessment & Plan: Look at it follow-up biceps tenodesis rotator cuff repair she is in physical therapy has a pulley at home her husband fixed for her.  She needs to work on this discontinue using her sling using her arm more work on her pulley until she can get her arm up overhead and then gradually work on Print production planner.  Chief Complaint:  Chief Complaint  Patient presents with   Right Shoulder - Routine Post Op    Doing ok, real sore because of the therapy. Have had 3 sessions so far. Use Tylenol sometimes to help with the therapy and they do some.   Visit Diagnoses:  1. Impingement syndrome of left shoulder     Plan:   Post-Op Visit Note   Patient: Karina Aguirre           Date of Birth: November 01, 1960           MRN: [id number] Visit Date: 11/03/2022 PCP: Carylon Perches, MD   Assessment & Plan:  Chief Complaint:  Chief Complaint  Patient presents with   Right Shoulder - Routine Post Op    Doing ok, real sore because of the therapy. Have had 3 sessions so far. Use Tylenol sometimes to help with the therapy and they do some.   Visit Diagnoses:  1. Impingement syndrome of left shoulder     Plan: Continue Pulley  continue therapy.  Recheck 5 weeks. 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 Follow-Up Instructions: Return in about 5 weeks (around 12/08/2022).   Orders:  No orders of the defined types were placed in this encounter.  No orders of the defined types were placed in this encounter.   Imaging: No results found.  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 08/04/2022   Tendinitis of upper biceps tendon of left shoulder 08/04/2022   Other intervertebral disc degeneration, lumbar region 06/23/2022   Impingement  syndrome of left shoulder 05/26/2022   Trochanteric bursitis, left hip 12/25/2021   Protrusion of lumbar intervertebral disc 10/01/2021   Carpal tunnel syndrome of right wrist 06/21/2016   Pain in left ankle and joints of left foot 01/15/2016   CHEST PAIN 11/07/2009   Past Medical History:  Diagnosis Date   GERD (gastroesophageal reflux disease)    Seizure (HCC)     Family History  Problem Relation Age of Onset   Hypertension Father     Past Surgical History:  Procedure Laterality Date   BLADDER DIVERTICULECTOMY     BREAST SURGERY     x2   ELBOW SURGERY     HERNIA REPAIR     NOSE SURGERY     x2   PANCREATECTOMY     Social History   Occupational History   Not on file  Tobacco Use   Smoking status: Never   Smokeless tobacco: Never  Substance and Sexual Activity   Alcohol use: No   Drug use: No   Sexual activity: Not on file    Continue therapy continue pulley.  Recheck 5 weeks.  Follow-Up Instructions: Return in about 5 weeks (around 12/08/2022).   Orders:  No orders of  the defined types were placed in this encounter.  No orders of the defined types were placed in this encounter.   Imaging: No results found.  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 08/04/2022   Tendinitis of upper biceps tendon of left shoulder 08/04/2022   Other intervertebral disc degeneration, lumbar region 06/23/2022   Impingement syndrome of left shoulder 05/26/2022   Trochanteric bursitis, left hip 12/25/2021   Protrusion of lumbar intervertebral disc 10/01/2021   Carpal tunnel syndrome of right wrist 06/21/2016   Pain in left ankle and joints of left foot 01/15/2016   CHEST PAIN 11/07/2009   Past Medical History:  Diagnosis Date   GERD (gastroesophageal reflux disease)    Seizure (HCC)     Family History  Problem Relation Age of Onset   Hypertension Father     Past Surgical History:  Procedure  Laterality Date   BLADDER DIVERTICULECTOMY     BREAST SURGERY     x2   ELBOW SURGERY     HERNIA REPAIR     NOSE SURGERY     x2   PANCREATECTOMY     Social History   Occupational History   Not on file  Tobacco Use   Smoking status: Never   Smokeless tobacco: Never  Substance and Sexual Activity   Alcohol use: No   Drug use: No   Sexual activity: Not on file

## 2022-11-04 ENCOUNTER — Ambulatory Visit: Payer: 59 | Admitting: Physical Therapy

## 2022-11-04 DIAGNOSIS — M25612 Stiffness of left shoulder, not elsewhere classified: Secondary | ICD-10-CM

## 2022-11-04 DIAGNOSIS — G8929 Other chronic pain: Secondary | ICD-10-CM

## 2022-11-04 DIAGNOSIS — M25512 Pain in left shoulder: Secondary | ICD-10-CM | POA: Diagnosis not present

## 2022-11-04 NOTE — Therapy (Signed)
OUTPATIENT PHYSICAL THERAPY SHOULDER EVALUATION   Patient Name: Karina Aguirre MRN: [id number] DOB:Aug 20, 1960, 61 y.o., female Today's Date: 11/04/2022  END OF SESSION:  PT End of Session - 11/04/22 1559     Visit Number 4    Number of Visits 12    Date for PT Re-Evaluation 12/07/22    Authorization Type FOTO    PT Start Time 0355    PT Stop Time 0452    PT Time Calculation (min) 57 min    Activity Tolerance Patient tolerated treatment well    Behavior During Therapy WFL for tasks assessed/performed             Past Medical History:  Diagnosis Date   GERD (gastroesophageal reflux disease)    Seizure (HCC)    Past Surgical History:  Procedure Laterality Date   BLADDER DIVERTICULECTOMY     BREAST SURGERY     x2   ELBOW SURGERY     HERNIA REPAIR     NOSE SURGERY     x2   PANCREATECTOMY     Patient Active Problem List   Diagnosis Date Noted   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  THERAPY DIAG:  Chronic left shoulder pain  Stiffness of left shoulder, not elsewhere classified  Rationale for Evaluation and Treatment: Rehabilitation  ONSET DATE: 09/06/22 (surgery date).  SUBJECTIVE:                                                                                                                                                                                      SUBJECTIVE STATEMENT: Sore but doing well.  PERTINENT HISTORY: See  above.  PAIN:  Are you having pain? No  PRECAUTIONS: Other: Begin with PROM, PAROM to patient's left shoulder.   AAROM.   WEIGHT BEARING RESTRICTIONS:  No left UE weight bearing.  FALLS:  Has patient fallen in last 6 months? 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  NEXT MD VISIT:   OBJECTIVE:   PATIENT SURVEYS:  FOTO .   UPPER EXTREMITY ROM:   In supine:  Gentle left shoulder PROM into flexion is 75 degrees, ER is -20 degrees from neutral and abduction 40 degrees.  UPPER EXTREMITY ROM:  Passive ROM Right eval Left Eval(10/26/22) 11/04/22  Shoulder flexion  75 degrees 125 degrees  Shoulder extension     Shoulder abduction     Shoulder adduction     Shoulder extension     Shoulder internal rotation     Shoulder external rotation  -20 degrees from neutral 35 degrees  Elbow flexion     Elbow extension     Wrist flexion     Wrist extension     Wrist ulnar deviation     Wrist radial deviation     Wrist pronation     Wrist supination      (Blank rows = not tested)  PALPATION:  Incisional sites appear to be healing very well.  Minimal palpable left shoulder tenderness.  CC is pain into her middle deltoid and occasions to elbow which appear to be referred in nature.   TODAY'S TREATMENT:                                                                                                                                         DATE: 11/04/22:                                     EXERCISE LOG  Exercise Repetitions and Resistance Comments  Pulleys 5 minutes   Seated UE Ranger 5 minutes.   Wall ladder 5 minutes   Ball roll outs on plinth 2 minutes   Supine cane bench press into flexion 2 minutes.   In supine:  PROM to patient's left shoulder x 11 minutes f/b vasopneumatic on low with pillow between elbow and thorax x 15  minutes on low.    11/02/22:                                    EXERCISE LOG  Exercise Repetitions and Resistance Comments  Pulleys 5 minutes.   Seated UE Ranger 5 minutes.   Wall ladder 5 minutes (to 22).           In supine:  PROM x 8 minutes to patient's left shoulder f/b vasopneumatic on low with pillow between elbow and thorax x 15 minutes on low.     PATIENT EDUCATION: Education details: See below. Person educated: Patient Education method: Explanation, Demonstration, Tactile cues, Verbal cues, and Handouts Education comprehension: verbalized understanding, returned demonstration, verbal cues required, and tactile cues required  HOME EXERCISE PROGRAM: HOME EXERCISE PROGRAM Created by [place] Perri Aragones Sep 5th, 2024 View at [email or web address] using code: [email or web address]  Page 1 of 1  1 Exercise WAND FLEXION - SUPINE Lying on your back and holding a wand or cane, slowly raise the wand towards overhead. Use your unaffected arm to assist with the movement. Repeat 15 Times Hold 5 Seconds Complete 2 Sets Perform 4 Times a Da  ASSESSMENT:  CLINICAL IMPRESSION:  Patient is doing very well.  Her range of motion has improved very nicely since starting PT.  Added supine cane bench press into flexion today which she performed with excellent technique.  OBJECTIVE IMPAIRMENTS: decreased activity tolerance, decreased ROM, and pain.   ACTIVITY LIMITATIONS: carrying, lifting, and reach over head  PARTICIPATION LIMITATIONS: meal prep, cleaning, and laundry  PERSONAL FACTORS: Time since onset of injury/illness/exacerbation are also affecting patient's functional outcome.   REHAB POTENTIAL: Good  CLINICAL DECISION MAKING: Stable/uncomplicated  EVALUATION COMPLEXITY: Low   GOALS:  SHORT TERM GOALS: Target date: 11/09/22  Ind with an initial HEP. Goal status: INITIAL   LONG TERM GOALS: Target date: 12/07/22  Ind with an advanced HEP.  Goal status: INITIAL  2.  Active left  shoulder flexion to 145 degrees so the patient can easily reach overhead.  Goal status: INITIAL  3.  Active ER to 70 degrees+ to allow for easily donning/doffing of apparel.  Goal status: INITIAL  4.  Increase ROM so patient is able to reach behind back to L3.  Goal status: INITIAL  5.  Increase left shoulder strength to a solid 4+/5 to increase stability for performance of functional activities.  Goal status: INITIAL  6.  Perform ADL's with pain not > 3/10.  Goal status: INITIAL  PLAN:  PT FREQUENCY: 2x/week  PT DURATION: 6 weeks  PLANNED INTERVENTIONS: Therapeutic exercises, Therapeutic activity, Neuromuscular re-education, Patient/Family education, Self Care, Electrical stimulation, Cryotherapy, Moist heat, Vasopneumatic device, Ultrasound, and Manual therapy  PLAN FOR NEXT SESSION: P and PAROM, review HEP, Pulleys, wall ladder UE Ranger.  Vasopneumatic with pillow between left elbow and thorax.   Faviola Klare, [place], PT 11/04/2022, 5:05 PM

## 2022-11-09 ENCOUNTER — Ambulatory Visit: Payer: 59 | Admitting: Physical Therapy

## 2022-11-09 DIAGNOSIS — G8929 Other chronic pain: Secondary | ICD-10-CM

## 2022-11-09 DIAGNOSIS — M25612 Stiffness of left shoulder, not elsewhere classified: Secondary | ICD-10-CM

## 2022-11-09 DIAGNOSIS — M25512 Pain in left shoulder: Secondary | ICD-10-CM | POA: Diagnosis not present

## 2022-11-09 NOTE — Therapy (Signed)
OUTPATIENT PHYSICAL THERAPY SHOULDER EVALUATION   Patient Name: Karina Aguirre MRN: [id number] DOB:06/19/1960, 62 y.o., female Today's Date: 11/09/2022  END OF SESSION:  PT End of Session - 11/09/22 1609     Visit Number 5    Number of Visits 12    Date for PT Re-Evaluation 12/07/22    Authorization Type FOTO    PT Start Time 0402    PT Stop Time 0454    PT Time Calculation (min) 52 min    Activity Tolerance Patient tolerated treatment well    Behavior During Therapy WFL for tasks assessed/performed             Past Medical History:  Diagnosis Date   GERD (gastroesophageal reflux disease)    Seizure (HCC)    Past Surgical History:  Procedure Laterality Date   BLADDER DIVERTICULECTOMY     BREAST SURGERY     x2   ELBOW SURGERY     HERNIA REPAIR     NOSE SURGERY     x2   PANCREATECTOMY     Patient Active Problem List   Diagnosis Date Noted   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  THERAPY DIAG:  Chronic left shoulder pain  Stiffness of left shoulder, not elsewhere classified  Rationale for Evaluation and Treatment: Rehabilitation  ONSET DATE: 09/06/22 (surgery date).  SUBJECTIVE:                                                                                                                                                                                      SUBJECTIVE STATEMENT: Sore but doing well.  PERTINENT HISTORY: See  above.  PAIN:  Are you having pain? No  PRECAUTIONS: Other: Begin with PROM, PAROM to patient's left shoulder.   AAROM.   WEIGHT BEARING RESTRICTIONS:  No left UE weight bearing.  FALLS:  Has patient fallen in last 6 months? 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  NEXT MD VISIT:   OBJECTIVE:   PATIENT SURVEYS:  FOTO .   UPPER EXTREMITY ROM:   In supine:  Gentle left shoulder PROM into flexion is 75 degrees, ER is -20 degrees from neutral and abduction 40 degrees.  UPPER EXTREMITY ROM:  Passive ROM Right eval Left Eval(10/26/22) 11/04/22  Shoulder flexion  75 degrees 125 degrees  Shoulder extension     Shoulder abduction     Shoulder adduction     Shoulder extension     Shoulder internal rotation     Shoulder external rotation  -20 degrees from neutral 35 degrees  Elbow flexion     Elbow extension     Wrist flexion     Wrist extension     Wrist ulnar deviation     Wrist radial deviation     Wrist pronation     Wrist supination      (Blank rows = not tested)  PALPATION:  Incisional sites appear to be healing very well.  Minimal palpable left shoulder tenderness.  CC is pain into her middle deltoid and occasions to elbow which appear to be referred in nature.   TODAY'S TREATMENT:                                                                                                                                         DATE:   11/09/22                                     EXERCISE LOG  Exercise Repetitions and Resistance Comments  Pulleys 6 minutes   Wall ladder 5 minutes (25).   Corner stretch 1 minutes   Standing UE Ranger 3 minutes   Supine cane stretch into flexion 2 minutes   PROM x 11 minutes  f/b vasopneumatic on low x 15 minutes.  11/04/22:                                     EXERCISE LOG  Exercise  Repetitions and Resistance Comments  Pulleys 5 minutes   Seated UE Ranger 5 minutes.   Wall ladder 5 minutes   Ball roll outs on plinth 2 minutes   Supine cane bench press into flexion 2 minutes.   In supine:  PROM to patient's left shoulder x 11 minutes f/b vasopneumatic on low with pillow between elbow and thorax x 15 minutes on low.    11/02/22:                                    EXERCISE LOG  Exercise Repetitions and Resistance Comments  Pulleys 5 minutes.   Seated UE Ranger 5 minutes.   Wall ladder 5 minutes (to 22).  In supine:  PROM x 8 minutes to patient's left shoulder f/b vasopneumatic on low with pillow between elbow and thorax x 15 minutes on low.     PATIENT EDUCATION: Education details: See below. Person educated: Patient Education method: Explanation, Demonstration, Tactile cues, Verbal cues, and Handouts Education comprehension: verbalized understanding, returned demonstration, verbal cues required, and tactile cues required  HOME EXERCISE PROGRAM: HOME EXERCISE PROGRAM Created by [place] Sep 10th, 2024 View at [email or web address] using code: [email or web address]  Page 1 of 1 2 Exercises Supine cane flexion for impingement  While laying on back, hold cane in palm and point thumb up. Gently push arm above overhead until stretch is felt. Repeat 10 Times Hold 10 Seconds Complete 2 Sets Perform 4 Times a Day PECTORALIS CORNER STRETCH While standing at a corner of a wall, place your arms on the walls with elobws bent so that your upper arms are horizontal and your forearms are directed upwards as shown. Take one step forward towards the corner. Bend your front knee until a stretch is felt along the front of your chest and/or shoulders. Your arms should be pointed downward towards the ground. NOTE: Your legs should control the stretch by bending or straightening your front knee. Repeat 4 Times Hold 30 Seconds Complete 1 Set Perform 4 Times a  Day  ASSESSMENT:  CLINICAL IMPRESSION:  Patient doing well and range of motion continues to improve.  Added another supine cane stretch to improve flexion and corner stretch which is also included in her HEP.  Picture provided.  Wall ladder height improved to 25.  OBJECTIVE IMPAIRMENTS: decreased activity tolerance, decreased ROM, and pain.   ACTIVITY LIMITATIONS: carrying, lifting, and reach over head  PARTICIPATION LIMITATIONS: meal prep, cleaning, and laundry  PERSONAL FACTORS: Time since onset of injury/illness/exacerbation are also affecting patient's functional outcome.   REHAB POTENTIAL: Good  CLINICAL DECISION MAKING: Stable/uncomplicated  EVALUATION COMPLEXITY: Low   GOALS:  SHORT TERM GOALS: Target date: 11/09/22  Ind with an initial HEP. Goal status: INITIAL   LONG TERM GOALS: Target date: 12/07/22  Ind with an advanced HEP.  Goal status: INITIAL  2.  Active left shoulder flexion to 145 degrees so the patient can easily reach overhead.  Goal status: INITIAL  3.  Active ER to 70 degrees+ to allow for easily donning/doffing of apparel.  Goal status: INITIAL  4.  Increase ROM so patient is able to reach behind back to L3.  Goal status: INITIAL  5.  Increase left shoulder strength to a solid 4+/5 to increase stability for performance of functional activities.  Goal status: INITIAL  6.  Perform ADL's with pain not > 3/10.  Goal status: INITIAL  PLAN:  PT FREQUENCY: 2x/week  PT DURATION: 6 weeks  PLANNED INTERVENTIONS: Therapeutic exercises, Therapeutic activity, Neuromuscular re-education, Patient/Family education, Self Care, Electrical stimulation, Cryotherapy, Moist heat, Vasopneumatic device, Ultrasound, and Manual therapy  PLAN FOR NEXT SESSION: P and PAROM, review HEP, Pulleys, wall ladder UE Ranger.  Vasopneumatic with pillow between left elbow and thorax.   Mable Lashley, [place], PT 11/09/2022, 5:01 PM

## 2022-11-11 ENCOUNTER — Ambulatory Visit: Payer: 59 | Admitting: Physical Therapy

## 2022-11-11 DIAGNOSIS — M25612 Stiffness of left shoulder, not elsewhere classified: Secondary | ICD-10-CM

## 2022-11-11 DIAGNOSIS — G8929 Other chronic pain: Secondary | ICD-10-CM

## 2022-11-11 DIAGNOSIS — M25512 Pain in left shoulder: Secondary | ICD-10-CM | POA: Diagnosis not present

## 2022-11-11 NOTE — Therapy (Signed)
OUTPATIENT PHYSICAL THERAPY SHOULDER EVALUATION   Patient Name: Karina Aguirre MRN: [id number] DOB:Oct 03, 1960, 62 y.o., female Today's Date: 11/11/2022  END OF SESSION:  PT End of Session - 11/11/22 1610     Visit Number 6    Number of Visits 12    Date for PT Re-Evaluation 12/07/22    Authorization Type FOTO    Activity Tolerance Patient tolerated treatment well    Behavior During Therapy [facility] for tasks assessed/performed             Past Medical History:  Diagnosis Date   GERD (gastroesophageal reflux disease)    Seizure (HCC)    Past Surgical History:  Procedure Laterality Date   BLADDER DIVERTICULECTOMY     BREAST SURGERY     x2   ELBOW SURGERY     HERNIA REPAIR     NOSE SURGERY     x2   PANCREATECTOMY     Patient Active Problem List   Diagnosis Date Noted   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  THERAPY DIAG:  Chronic left shoulder pain  Stiffness of left shoulder, not elsewhere classified  Rationale for Evaluation and Treatment: Rehabilitation  ONSET DATE: 09/06/22 (surgery date).  SUBJECTIVE:                                                                                                                                                                                      SUBJECTIVE STATEMENT: Sore but doing well.  PERTINENT HISTORY: See above.  PAIN:  Are you having pain? No  PRECAUTIONS: Other: Begin with PROM,  PAROM to patient's left shoulder.   AAROM.   WEIGHT BEARING RESTRICTIONS:  No left UE weight bearing.  FALLS:  Has patient fallen in last 6 months? 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  NEXT MD VISIT:   OBJECTIVE:   PATIENT SURVEYS:  FOTO .   UPPER EXTREMITY ROM:   In supine:  Gentle left shoulder PROM into flexion is 75 degrees, ER is -  20 degrees from neutral and abduction 40 degrees.  UPPER EXTREMITY ROM:  Passive ROM Right eval Left Eval(10/26/22) 11/04/22 11/11/22  Shoulder flexion  75 degrees 125 degrees 135 in supine  Shoulder extension      Shoulder abduction      Shoulder adduction      Shoulder extension      Shoulder internal rotation      Shoulder external rotation  -20 degrees from neutral 35 degrees 48 degrees  Elbow flexion      Elbow extension      Wrist flexion      Wrist extension      Wrist ulnar deviation      Wrist radial deviation      Wrist pronation      Wrist supination       (Blank rows = not tested)  PALPATION:  Incisional sites appear to be healing very well.  Minimal palpable left shoulder tenderness.  CC is pain into her middle deltoid and occasions to elbow which appear to be referred in nature.   TODAY'S TREATMENT:                                                                                                                                         DATE:   11/11/22:                                     EXERCISE LOG  Exercise Repetitions and Resistance Comments  Pulleys 5 minutes   Standing UE Ranger 5 minutes   Towel on wall with thumb out 2 minutes   Corner stretch 2 minutes        PROM to patient's left shoulder x 10 minutes f/b vasopneumatic on low x 20 minutes.     11/09/22                                     EXERCISE LOG  Exercise Repetitions and Resistance  Comments  Pulleys 6 minutes   Wall ladder 5 minutes (25).   Corner stretch 1 minutes   Standing UE Ranger 3 minutes   Supine cane stretch into flexion 2 minutes   PROM x 11 minutes  f/b vasopneumatic on low x 15 minutes.  11/04/22:                                     EXERCISE LOG  Exercise Repetitions and Resistance Comments  Pulleys 5 minutes   Seated UE Ranger 5 minutes.   Wall ladder 5 minutes   Ball roll outs on plinth 2 minutes   Supine cane bench press into flexion 2 minutes.  In supine:  PROM to patient's left shoulder x 11 minutes f/b vasopneumatic on low with pillow between elbow and thorax x 15 minutes on low.     PATIENT EDUCATION: Education details: See below. Person educated: Patient Education method: Explanation, Demonstration, Tactile cues, Verbal cues, and Handouts Education comprehension: verbalized understanding, returned demonstration, verbal cues required, and tactile cues required  HOME EXERCISE PROGRAM: HOME EXERCISE PROGRAM Created by [place] Temima Kutsch Sep 12th, 2024 View at [email or web address] using code: [email or web address]  Page 1 of 1 1 Exercise Slide arm up wall with thumb pointing toward you. Repeat 10 Times Hold 10 Seconds Complete 1 Set Perform 4 Times ASSESSMENT:  CLINICAL IMPRESSION:  Patient is very motivated and has been compliant her HEP.  Added arm slides with thumb out today.  Very good progress with further range of motion gains this week.  OBJECTIVE IMPAIRMENTS: decreased activity tolerance, decreased ROM, and pain.   ACTIVITY LIMITATIONS: carrying, lifting, and reach over head  PARTICIPATION LIMITATIONS: meal prep, cleaning, and laundry  PERSONAL FACTORS: Time since onset of injury/illness/exacerbation are also affecting patient's functional outcome.   REHAB POTENTIAL: Good  CLINICAL DECISION MAKING: Stable/uncomplicated  EVALUATION COMPLEXITY: Low   GOALS:  SHORT TERM GOALS: Target date: 11/09/22  Ind with an initial  HEP. Goal status: INITIAL   LONG TERM GOALS: Target date: 12/07/22  Ind with an advanced HEP.  Goal status: INITIAL  2.  Active left shoulder flexion to 145 degrees so the patient can easily reach overhead.  Goal status: INITIAL  3.  Active ER to 70 degrees+ to allow for easily donning/doffing of apparel.  Goal status: INITIAL  4.  Increase ROM so patient is able to reach behind back to L3.  Goal status: INITIAL  5.  Increase left shoulder strength to a solid 4+/5 to increase stability for performance of functional activities.  Goal status: INITIAL  6.  Perform ADL's with pain not > 3/10.  Goal status: INITIAL  PLAN:  PT FREQUENCY: 2x/week  PT DURATION: 6 weeks  PLANNED INTERVENTIONS: Therapeutic exercises, Therapeutic activity, Neuromuscular re-education, Patient/Family education, Self Care, Electrical stimulation, Cryotherapy, Moist heat, Vasopneumatic device, Ultrasound, and Manual therapy  PLAN FOR NEXT SESSION: P and PAROM, review HEP, Pulleys, wall ladder UE Ranger.  Vasopneumatic with pillow between left elbow and thorax.   Liset Mcmonigle, [place], PT 11/11/2022, 5:09 PM

## 2022-11-16 ENCOUNTER — Ambulatory Visit: Payer: 59 | Admitting: Physical Therapy

## 2022-11-16 DIAGNOSIS — M25512 Pain in left shoulder: Secondary | ICD-10-CM | POA: Diagnosis not present

## 2022-11-16 DIAGNOSIS — G8929 Other chronic pain: Secondary | ICD-10-CM

## 2022-11-16 DIAGNOSIS — M25612 Stiffness of left shoulder, not elsewhere classified: Secondary | ICD-10-CM

## 2022-11-16 NOTE — Therapy (Signed)
OUTPATIENT PHYSICAL THERAPY SHOULDER EVALUATION   Patient Name: Karina Aguirre MRN: [id number] DOB:12/06/1960, 62 y.o., female Today's Date: 11/16/2022  END OF SESSION:  PT End of Session - 11/16/22 1653     Visit Number 7    Number of Visits 12    Date for PT Re-Evaluation 12/07/22    Authorization Type FOTO    PT Start Time 0402    PT Stop Time 0503    PT Time Calculation (min) 61 min    Activity Tolerance Patient tolerated treatment well    Behavior During Therapy WFL for tasks assessed/performed             Past Medical History:  Diagnosis Date   GERD (gastroesophageal reflux disease)    Seizure (HCC)    Past Surgical History:  Procedure Laterality Date   BLADDER DIVERTICULECTOMY     BREAST SURGERY     x2   ELBOW SURGERY     HERNIA REPAIR     NOSE SURGERY     x2   PANCREATECTOMY     Patient Active Problem List   Diagnosis Date Noted   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  THERAPY DIAG:  Chronic left shoulder pain  Stiffness of left shoulder, not elsewhere classified  Rationale for Evaluation and Treatment: Rehabilitation  ONSET DATE: 09/06/22 (surgery date).  SUBJECTIVE:                                                                                                                                                                                      SUBJECTIVE STATEMENT: Sore but doing well.  PERTINENT HISTORY: See  above.  PAIN:  Are you having pain? No  PRECAUTIONS: Other: Begin with PROM, PAROM to patient's left shoulder.   AAROM.   WEIGHT BEARING RESTRICTIONS:  No left UE weight bearing.  FALLS:  Has patient fallen in last 6 months? 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  NEXT MD VISIT:   OBJECTIVE:   PATIENT SURVEYS:  FOTO .   UPPER EXTREMITY ROM:   In supine:  Gentle left shoulder PROM into flexion is 75 degrees, ER is -20 degrees from neutral and abduction 40 degrees.  UPPER EXTREMITY ROM:  Passive ROM Right eval Left Eval(10/26/22) 11/04/22 11/11/22  Shoulder flexion  75 degrees 125 degrees 135 in supine  Shoulder extension      Shoulder abduction      Shoulder adduction      Shoulder extension      Shoulder internal rotation      Shoulder external rotation  -20 degrees from neutral 35 degrees 48 degrees  Elbow flexion      Elbow extension      Wrist flexion      Wrist extension      Wrist ulnar deviation      Wrist radial deviation      Wrist pronation      Wrist supination       (Blank rows = not tested)  PALPATION:  Incisional sites appear to be healing very well.  Minimal palpable left shoulder tenderness.  CC is pain into her middle deltoid and occasions to elbow which appear to be referred in nature.   TODAY'S TREATMENT:                                                                                                                                         DATE:   11/16/22:                                     EXERCISE LOG  Exercise Repetitions and Resistance Comments  Pulleys into flexion 5 minutes   Pulleys into ER 3 minutes   Wall ladder 5 minutes   Towel on wall into flexion 4 minutes   UE ranger on wall into flexion, circles (CW and CCW) 5 minutes.   PROM to patient's left shoulder x 14 minutes with  low load long duration stretching technique utilized f/b vasopneumatic on low x 20 minutes.    11/11/22:                                     EXERCISE LOG  Exercise Repetitions and Resistance Comments  Pulleys 5 minutes   Standing UE Ranger 5 minutes   Towel on wall with thumb out 2 minutes   Corner stretch 2 minutes        PROM to patient's left shoulder x 10 minutes f/b vasopneumatic on low x 20 minutes.     11/09/22                                     EXERCISE  LOG  Exercise Repetitions and Resistance Comments  Pulleys 6 minutes   Wall ladder 5 minutes (25).   Corner stretch 1 minutes   Standing UE Ranger 3 minutes   Supine cane stretch into flexion 2 minutes   PROM x 11 minutes  f/b vasopneumatic on low x 15 minutes.    PATIENT EDUCATION: Education details: See below. Person educated: Patient Education method: Explanation, Demonstration, Tactile cues, Verbal cues, and Handouts Education comprehension: verbalized understanding, returned demonstration, verbal cues required, and tactile cues required  HOME EXERCISE PROGRAM: HOME EXERCISE PROGRAM Created by [place] Treyana Sturgell Sep 17th, 2024 View at [email or web address] using code: [email or web address]  Page 1 of 1 1 Exercise Shoulder External Rotation AAROM Tabletop Exercise With chair and body still positioned perpendicular to the table, rest the forearm of affected side on table with palm facing down and elbow bent at 90 degrees. Slowly start to bend whole body forward until a stretch is felt in affected shoulder. When doing this exercise, the forearm should stay in place and not slide when the body is bending forward. Hold this position when stretch is felt in affected shoulder for 5-10 seconds, then slowly raise body back up to starting position Repeat 10 Times Hold 10 Seconds Complete 1 Set Perform 4 Times a Day **Patient will also use her pulleys to do external rotation.  ASSESSMENT:  CLINICAL IMPRESSION:  Patient is  very motivated and compliant to her HEP.  Added an external rotation stretch using pulleys and a seated table stretch also for improving external rotation.  OBJECTIVE IMPAIRMENTS: decreased activity tolerance, decreased ROM, and pain.   ACTIVITY LIMITATIONS: carrying, lifting, and reach over head  PARTICIPATION LIMITATIONS: meal prep, cleaning, and laundry  PERSONAL FACTORS: Time since onset of injury/illness/exacerbation are also affecting patient's functional outcome.   REHAB POTENTIAL: Good  CLINICAL DECISION MAKING: Stable/uncomplicated  EVALUATION COMPLEXITY: Low   GOALS:  SHORT TERM GOALS: Target date: 11/09/22  Ind with an initial HEP. Goal status: INITIAL   LONG TERM GOALS: Target date: 12/07/22  Ind with an advanced HEP.  Goal status: INITIAL  2.  Active left shoulder flexion to 145 degrees so the patient can easily reach overhead.  Goal status: INITIAL  3.  Active ER to 70 degrees+ to allow for easily donning/doffing of apparel.  Goal status: INITIAL  4.  Increase ROM so patient is able to reach behind back to L3.  Goal status: INITIAL  5.  Increase left shoulder strength to a solid 4+/5 to increase stability for performance of functional activities.  Goal status: INITIAL  6.  Perform ADL's with pain not > 3/10.  Goal status: INITIAL  PLAN:  PT FREQUENCY: 2x/week  PT DURATION: 6 weeks  PLANNED INTERVENTIONS: Therapeutic exercises, Therapeutic activity, Neuromuscular re-education, Patient/Family education, Self Care, Electrical stimulation, Cryotherapy, Moist heat, Vasopneumatic device, Ultrasound, and Manual therapy  PLAN FOR NEXT SESSION: P and PAROM, review HEP, Pulleys, wall ladder UE Ranger.  Vasopneumatic with pillow between left elbow and thorax.   Allicia Culley, [place], PT 11/16/2022, 5:18 PM

## 2022-11-18 ENCOUNTER — Ambulatory Visit: Payer: 59 | Admitting: Physical Therapy

## 2022-11-18 DIAGNOSIS — M25612 Stiffness of left shoulder, not elsewhere classified: Secondary | ICD-10-CM

## 2022-11-18 DIAGNOSIS — G8929 Other chronic pain: Secondary | ICD-10-CM

## 2022-11-18 DIAGNOSIS — M25512 Pain in left shoulder: Secondary | ICD-10-CM | POA: Diagnosis not present

## 2022-11-18 NOTE — Therapy (Signed)
OUTPATIENT PHYSICAL THERAPY SHOULDER EVALUATION   Patient Name: Karina Aguirre MRN: [id number] DOB:Aug 27, 1960, 62 y.o., female Today's Date: 11/18/2022  END OF SESSION:  PT End of Session - 11/18/22 1610     Visit Number 8    Number of Visits 12    Date for PT Re-Evaluation 12/07/22    Authorization Type FOTO    PT Start Time 0232    PT Stop Time 0319    PT Time Calculation (min) 47 min    Activity Tolerance Patient tolerated treatment well    Behavior During Therapy WFL for tasks assessed/performed              Past Medical History:  Diagnosis Date   GERD (gastroesophageal reflux disease)    Seizure (HCC)    Past Surgical History:  Procedure Laterality Date   BLADDER DIVERTICULECTOMY     BREAST SURGERY     x2   ELBOW SURGERY     HERNIA REPAIR     NOSE SURGERY     x2   PANCREATECTOMY     Patient Active Problem List   Diagnosis Date Noted   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  THERAPY DIAG:  Chronic left shoulder pain  Stiffness of left shoulder, not elsewhere classified  Rationale for Evaluation and Treatment: Rehabilitation  ONSET DATE: 09/06/22 (surgery date).  SUBJECTIVE:                                                                                                                                                                                      SUBJECTIVE STATEMENT: Sore today.  PERTINENT HISTORY: See  above.  PAIN:  Are you having pain? 4/10.  PRECAUTIONS: Other: Begin with PROM, PAROM to patient's left shoulder.   AAROM.   WEIGHT BEARING RESTRICTIONS:  No left UE weight bearing.  FALLS:  Has patient fallen in last 6 months? 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  NEXT MD VISIT:   OBJECTIVE:   PATIENT SURVEYS:  FOTO .  UPPER EXTREMITY ROM:   In supine:  Gentle left shoulder PROM into flexion is 75 degrees, ER is -20 degrees from neutral and abduction 40 degrees.  UPPER EXTREMITY ROM:  Passive ROM Right eval Left Eval(10/26/22) 11/04/22 11/11/22 11/18/22  Shoulder flexion  75 degrees 125 degrees 135 in supine 150 in supine  Shoulder extension       Shoulder abduction       Shoulder adduction       Shoulder extension       Shoulder internal rotation       Shoulder external rotation  -20 degrees from neutral 35 degrees 48 degrees 55 degrees.  Elbow flexion       Elbow extension       Wrist flexion       Wrist extension       Wrist ulnar deviation       Wrist radial deviation       Wrist pronation       Wrist supination        (Blank rows = not tested)  PALPATION:  Incisional sites appear to be healing very well.  Minimal palpable left shoulder tenderness.  CC is pain into her middle deltoid and occasions to elbow which appear to be referred in nature.   TODAY'S TREATMENT:                                                                                                                                         DATE:   11/18/22:                                     EXERCISE LOG  Exercise Repetitions and Resistance Comments  Pulleys 5 minutes   UE Ranger on wall. 5 minutes   Wall ladder 5 minutes.           In supine:  PROM x 8 minutes into left shoulder flexion and ER f/b vasopneumatic on low x 20 minutes.      11/16/22:                                     EXERCISE LOG  Exercise Repetitions and Resistance Comments  Pulleys into flexion 5 minutes   Pulleys into ER 3 minutes   Wall ladder 5 minutes   Towel on wall into flexion 4 minutes   UE ranger on wall into flexion, circles (CW and CCW) 5 minutes.   PROM to patient's left shoulder x 14 minutes with low load long duration stretching technique utilized f/b vasopneumatic on low x 20 minutes.    11/11/22:  EXERCISE LOG  Exercise Repetitions and Resistance Comments  Pulleys 5 minutes   Standing UE Ranger 5 minutes   Towel on wall with thumb out 2 minutes   Corner stretch 2 minutes        PROM to patient's left shoulder x 10 minutes f/b vasopneumatic on low x 20 minutes.        PATIENT EDUCATION: Education details: See below. Person educated: Patient Education method: Explanation, Demonstration, Tactile cues, Verbal cues, and Handouts Education comprehension: verbalized understanding, returned demonstration, verbal cues required, and tactile cues required  HOME EXERCISE PROGRAM:  HOME EXERCISE PROGRAM Created by [place] Markeem Noreen Sep 19th, 2024 View at [email or web address] using code: [email or web address] Total 1 Page 1 of 1 PROM Shoulder Flexion on Table Rest your affected shoulder's hand on a counter or table as pictured. Lean forward allowing your hand to slide forward increasing the range of motion at your shoulder. Lean back and repeat. Perform this exercise in a relatively pain-free range of motion. Repeat 10 Times Hold 5 Seconds Complete 2 Sets Perform 4 Times a Day  **1# ER stretch in supine in plane of scapula.  ASSESSMENT:  CLINICAL IMPRESSION:  Patient is making very good progress.  She states she can now use her left UE to do her hair.  Her passive left shoulder flexion has improved to 155 degrees and ER to 55 degrees.  OBJECTIVE IMPAIRMENTS: decreased activity tolerance, decreased  ROM, and pain.   ACTIVITY LIMITATIONS: carrying, lifting, and reach over head  PARTICIPATION LIMITATIONS: meal prep, cleaning, and laundry  PERSONAL FACTORS: Time since onset of injury/illness/exacerbation are also affecting patient's functional outcome.   REHAB POTENTIAL: Good  CLINICAL DECISION MAKING: Stable/uncomplicated  EVALUATION COMPLEXITY: Low   GOALS:  SHORT TERM GOALS: Target date: 11/09/22  Ind with an initial HEP. Goal status: INITIAL   LONG TERM GOALS: Target date: 12/07/22  Ind with an advanced HEP.  Goal status: INITIAL  2.  Active left shoulder flexion to 145 degrees so the patient can easily reach overhead.  Goal status: INITIAL  3.  Active ER to 70 degrees+ to allow for easily donning/doffing of apparel.  Goal status: INITIAL  4.  Increase ROM so patient is able to reach behind back to L3.  Goal status: INITIAL  5.  Increase left shoulder strength to a solid 4+/5 to increase stability for performance of functional activities.  Goal status: INITIAL  6.  Perform ADL's with pain not > 3/10.  Goal status: INITIAL  PLAN:  PT FREQUENCY: 2x/week  PT DURATION: 6 weeks  PLANNED INTERVENTIONS: Therapeutic exercises, Therapeutic activity, Neuromuscular re-education, Patient/Family education, Self Care, Electrical stimulation, Cryotherapy, Moist heat, Vasopneumatic device, Ultrasound, and Manual therapy  PLAN FOR NEXT SESSION: P and PAROM, review HEP, Pulleys, wall ladder UE Ranger.  Vasopneumatic with pillow between left elbow and thorax.   Idriss Quackenbush, [place], PT 11/18/2022, 4:17 PM

## 2022-11-23 ENCOUNTER — Ambulatory Visit: Payer: 59 | Admitting: Physical Therapy

## 2022-11-23 DIAGNOSIS — M25612 Stiffness of left shoulder, not elsewhere classified: Secondary | ICD-10-CM

## 2022-11-23 DIAGNOSIS — G8929 Other chronic pain: Secondary | ICD-10-CM

## 2022-11-23 DIAGNOSIS — M25512 Pain in left shoulder: Secondary | ICD-10-CM | POA: Diagnosis not present

## 2022-11-23 NOTE — Therapy (Signed)
OUTPATIENT PHYSICAL THERAPY SHOULDER EVALUATION   Patient Name: Karina Aguirre MRN: [id number] DOB:[birth year], 62 y.o., female Today's Date: 11/23/2022  END OF SESSION:  PT End of Session - 11/23/22 1549     Visit Number 9    Number of Visits 12    Date for PT Re-Evaluation 12/07/22    Authorization Type FOTO    PT Start Time 0342    PT Stop Time 0440    PT Time Calculation (min) 58 min    Activity Tolerance Patient tolerated treatment well    Behavior During Therapy WFL for tasks assessed/performed              Past Medical History:  Diagnosis Date   GERD (gastroesophageal reflux disease)    Seizure (HCC)    Past Surgical History:  Procedure Laterality Date   BLADDER DIVERTICULECTOMY     BREAST SURGERY     x2   ELBOW SURGERY     HERNIA REPAIR     NOSE SURGERY     x2   PANCREATECTOMY     Patient Active Problem List   Diagnosis Date Noted   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  THERAPY DIAG:  Chronic left shoulder pain  Stiffness of left shoulder, not elsewhere classified  Rationale for Evaluation and Treatment: Rehabilitation  ONSET DATE: 09/06/22 (surgery date).  SUBJECTIVE:                                                                                                                                                                                      SUBJECTIVE STATEMENT: Doing good.  PERTINENT HISTORY: See  above.  PAIN:  Are you having pain? 2/10.  PRECAUTIONS: Other: Begin with PROM, PAROM to patient's left shoulder.   AAROM.   WEIGHT BEARING RESTRICTIONS:  No left UE weight bearing.  FALLS:  Has patient fallen in last 6 months? 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  NEXT MD VISIT:   OBJECTIVE:   PATIENT SURVEYS:  FOTO .  UPPER EXTREMITY ROM:   In supine:  Gentle left shoulder PROM into flexion is 75 degrees, ER is -20 degrees from neutral and abduction 40 degrees.  UPPER EXTREMITY ROM:  Passive ROM Right eval Left Eval(10/26/22) 11/04/22 11/11/22 11/18/22  Shoulder flexion  75 degrees 125 degrees 135 in supine 150 in supine  Shoulder extension       Shoulder abduction       Shoulder adduction       Shoulder extension       Shoulder internal rotation       Shoulder external rotation  -20 degrees from neutral 35 degrees 48 degrees 55 degrees.  Elbow flexion       Elbow extension       Wrist flexion       Wrist extension       Wrist ulnar deviation       Wrist radial deviation       Wrist pronation       Wrist supination        (Blank rows = not tested)  PALPATION:  Incisional sites appear to be healing very well.  Minimal palpable left shoulder tenderness.  CC is pain into her middle deltoid and occasions to elbow which appear to be referred in nature.   TODAY'S TREATMENT:                                                                                                                                         DATE:   11/23/22:                                   EXERCISE LOG  Exercise Repetitions and Resistance Comments  Pulleys 5 minutes   Wall ladder 5 minutes   UE Ranger on wall 5 minutes   Shoulder abduction external rotation stretch in supine (fingers interlocked behind head) 6 reps 20 sec holds       PROM  to patient's left shoulder x 14 minutes with low load long duration stretching technique utilized f/b vasopneumatic on low x 20 minutes.    11/18/22:                                     EXERCISE LOG  Exercise Repetitions and Resistance Comments  Pulleys 5 minutes   UE Ranger on wall. 5 minutes   Wall ladder 5 minutes.           In supine:  PROM x 8 minutes into left shoulder flexion and ER f/b vasopneumatic on low x 20 minutes.     11/16/22:  EXERCISE LOG  Exercise Repetitions and Resistance Comments  Pulleys into flexion 5 minutes   Pulleys into ER 3 minutes   Wall ladder 5 minutes   Towel on wall into flexion 4 minutes   UE ranger on wall into flexion, circles (CW and CCW) 5 minutes.   PROM to patient's left shoulder x 15 minutes with low load long duration stretching technique utilized f/b vasopneumatic on low x 20 minutes.         PATIENT EDUCATION: Education details: See below. Person educated: Patient Education method: Explanation, Demonstration, Tactile cues, Verbal cues, and Handouts Education comprehension: verbalized understanding, returned demonstration, verbal cues required, and tactile cues required  HOME EXERCISE PROGRAM: PROGRAM HOME EXERCISE PROGRAM Created by [place] Snyder Colavito Sep 24th, 2024 View at [email or web address] using code: [email or web address]  Page 1 of 1 1 Exercise Shoulder abduction external rotation stretch Lie on back with hands behind head. Drop elbows out to side. Repeat 15 Times Hold 20 Seconds Complete 1 Set Perform 3 Times a Day   ASSESSMENT:  CLINICAL IMPRESSION:  Patient doing very well.  She had a busy weekend so didn't get a chance to do HEP like she would have liked to, nonetheless, she remains very motivated and is progressing well.  Added an other home exercise to improve ER which she performed with very good technique.   OBJECTIVE IMPAIRMENTS: decreased activity tolerance, decreased ROM, and  pain.   ACTIVITY LIMITATIONS: carrying, lifting, and reach over head  PARTICIPATION LIMITATIONS: meal prep, cleaning, and laundry  PERSONAL FACTORS: Time since onset of injury/illness/exacerbation are also affecting patient's functional outcome.   REHAB POTENTIAL: Good  CLINICAL DECISION MAKING: Stable/uncomplicated  EVALUATION COMPLEXITY: Low   GOALS:  SHORT TERM GOALS: Target date: 11/09/22  Ind with an initial HEP. Goal status: INITIAL   LONG TERM GOALS: Target date: 12/07/22  Ind with an advanced HEP.  Goal status: INITIAL  2.  Active left shoulder flexion to 145 degrees so the patient can easily reach overhead.  Goal status: INITIAL  3.  Active ER to 70 degrees+ to allow for easily donning/doffing of apparel.  Goal status: INITIAL  4.  Increase ROM so patient is able to reach behind back to L3.  Goal status: INITIAL  5.  Increase left shoulder strength to a solid 4+/5 to increase stability for performance of functional activities.  Goal status: INITIAL  6.  Perform ADL's with pain not > 3/10.  Goal status: INITIAL  PLAN:  PT FREQUENCY: 2x/week  PT DURATION: 6 weeks  PLANNED INTERVENTIONS: Therapeutic exercises, Therapeutic activity, Neuromuscular re-education, Patient/Family education, Self Care, Electrical stimulation, Cryotherapy, Moist heat, Vasopneumatic device, Ultrasound, and Manual therapy  PLAN FOR NEXT SESSION: P and PAROM, review HEP, Pulleys, wall ladder UE Ranger.  Vasopneumatic with pillow between left elbow and thorax.   Rosabell Geyer, [place], PT 11/23/2022, 5:25 PM

## 2022-11-25 ENCOUNTER — Ambulatory Visit: Payer: 59 | Admitting: Physical Therapy

## 2022-11-25 DIAGNOSIS — M25512 Pain in left shoulder: Secondary | ICD-10-CM | POA: Diagnosis not present

## 2022-11-25 DIAGNOSIS — M25612 Stiffness of left shoulder, not elsewhere classified: Secondary | ICD-10-CM

## 2022-11-25 DIAGNOSIS — G8929 Other chronic pain: Secondary | ICD-10-CM

## 2022-11-25 NOTE — Therapy (Signed)
OUTPATIENT PHYSICAL THERAPY SHOULDER TREATMENT Patient Name: Karina Aguirre MRN: [id number] DOB:06/07/1960, 62 y.o., female Today's Date: 11/25/2022  END OF SESSION:  PT End of Session - 11/25/22 1724     Visit Number 10    Number of Visits 12    Date for PT Re-Evaluation 12/07/22    Authorization Type FOTO    PT Start Time 0400    PT Stop Time 0454    PT Time Calculation (min) 54 min    Activity Tolerance Patient tolerated treatment well    Behavior During Therapy WFL for tasks assessed/performed              Past Medical History:  Diagnosis Date   GERD (gastroesophageal reflux disease)    Seizure (HCC)    Past Surgical History:  Procedure Laterality Date   BLADDER DIVERTICULECTOMY     BREAST SURGERY     x2   ELBOW SURGERY     HERNIA REPAIR     NOSE SURGERY     x2   PANCREATECTOMY     Patient Active Problem List   Diagnosis Date Noted   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  THERAPY DIAG:  Chronic left shoulder pain  Stiffness of left shoulder, not elsewhere classified  Rationale for Evaluation and Treatment: Rehabilitation  ONSET DATE: 09/06/22 (surgery date).  SUBJECTIVE:                                                                                                                                                                                      SUBJECTIVE STATEMENT: Doing good.  Did my HEP.  PERTINENT HISTORY: See  above.  PAIN:  Are you having pain? 2/10.  PRECAUTIONS: Other: Begin with PROM, PAROM to patient's left shoulder.   AAROM.   WEIGHT BEARING RESTRICTIONS:  No left UE weight bearing.  FALLS:  Has patient fallen in last 6 months? 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  NEXT MD VISIT:   OBJECTIVE:   PATIENT SURVEYS:  FOTO .   UPPER EXTREMITY ROM:   In supine:  Gentle left shoulder PROM into flexion is 75 degrees, ER is -20 degrees from neutral and abduction 40 degrees.  UPPER EXTREMITY ROM:  Passive ROM Right eval Left Eval(10/26/22) 11/04/22 11/11/22 11/18/22  Shoulder flexion  75 degrees 125 degrees 135 in supine 150 in supine  Shoulder extension       Shoulder abduction       Shoulder adduction       Shoulder extension       Shoulder internal rotation       Shoulder external rotation  -20 degrees from neutral 35 degrees 48 degrees 55 degrees.  Elbow flexion       Elbow extension       Wrist flexion       Wrist extension       Wrist ulnar deviation       Wrist radial deviation       Wrist pronation       Wrist supination        (Blank rows = not tested)  PALPATION:  Incisional sites appear to be healing very well.  Minimal palpable left shoulder tenderness.  CC is pain into her middle deltoid and occasions to elbow which appear to be referred in nature.   TODAY'S TREATMENT:                                                                                                                                         DATE:   11/25/22:                                     EXERCISE LOG  Exercise Repetitions and Resistance Comments  Pulleys 5 minutes   UE Ranger on wall  5 minutes   Wall ladder 5 minutes.           In supine:  PROM to patient's left shoulder x 15 minutes with low load long duration stretching technique  utilized f/b vasopneumatic on low x 20 minutes.   11/23/22:                                   EXERCISE LOG  Exercise Repetitions and Resistance Comments  Pulleys 5 minutes   Wall ladder 5 minutes   UE Ranger on wall 5 minutes   Shoulder abduction external rotation stretch in supine (fingers interlocked behind head) 6 reps 20 sec holds       PROM to patient's left shoulder x 14 minutes with low load long duration stretching technique utilized f/b vasopneumatic on low x 20 minutes.    11/18/22:  EXERCISE LOG  Exercise Repetitions and Resistance Comments  Pulleys 5 minutes   UE Ranger on wall. 5 minutes   Wall ladder 5 minutes.           In supine:  PROM x 8 minutes into left shoulder flexion and ER f/b vasopneumatic on low x 20 minutes.     11/16/22:                                     EXERCISE LOG  Exercise Repetitions and Resistance Comments  Pulleys into flexion 5 minutes   Pulleys into ER 3 minutes   Wall ladder 5 minutes   Towel on wall into flexion 4 minutes   UE ranger on wall into flexion, circles (CW and CCW) 5 minutes.   PROM to patient's left shoulder x 15 minutes with low load long duration stretching technique utilized f/b vasopneumatic on low x 20 minutes.         PATIENT EDUCATION: Education details: See below. Person educated: Patient Education method: Explanation, Demonstration, Tactile cues, Verbal cues, and Handouts Education comprehension: verbalized understanding, returned demonstration, verbal cues required, and tactile cues required  HOME EXERCISE PROGRAM: PROGRAM HOME EXERCISE PROGRAM Created by [place] Lailynn Southgate Sep 24th, 2024 View at [email or web address] using code: [email or web address]  Page 1 of 1 1 Exercise Shoulder abduction external rotation stretch Lie on back with hands behind head. Drop elbows out to side. Repeat 15 Times Hold 20 Seconds Complete 1 Set Perform 3 Times a Day   ASSESSMENT:  CLINICAL  IMPRESSION:  Patient is making very good progress with regards to improved left shoulder range of motion.  Plan to introduce strengthening next week.  OBJECTIVE IMPAIRMENTS: decreased activity tolerance, decreased ROM, and pain.   ACTIVITY LIMITATIONS: carrying, lifting, and reach over head  PARTICIPATION LIMITATIONS: meal prep, cleaning, and laundry  PERSONAL FACTORS: Time since onset of injury/illness/exacerbation are also affecting patient's functional outcome.   REHAB POTENTIAL: Good  CLINICAL DECISION MAKING: Stable/uncomplicated  EVALUATION COMPLEXITY: Low   GOALS:  SHORT TERM GOALS: Target date: 11/09/22  Ind with an initial HEP. Goal status: INITIAL   LONG TERM GOALS: Target date: 12/07/22  Ind with an advanced HEP.  Goal status: Ongoing.  2.  Active left shoulder flexion to 145 degrees so the patient can easily reach overhead.  Goal status: Ongoing.  3.  Active ER to 70 degrees+ to allow for easily donning/doffing of apparel.  Goal status: Ongoing.  4.  Increase ROM so patient is able to reach behind back to L3.  Goal status: Ongoing.  5.  Increase left shoulder strength to a solid 4+/5 to increase stability for performance of functional activities.  Goal status: Ongoing.  6.  Perform ADL's with pain not > 3/10.  Goal status: Partially met.  PLAN:  PT FREQUENCY: 2x/week  PT DURATION: 6 weeks  PLANNED INTERVENTIONS: Therapeutic exercises, Therapeutic activity, Neuromuscular re-education, Patient/Family education, Self Care, Electrical stimulation, Cryotherapy, Moist heat, Vasopneumatic device, Ultrasound, and Manual therapy  PLAN FOR NEXT SESSION: UBE, yellow theraband RW4.  Progress Note Reporting Period 10/26/22 to 11/25/22.  See note below for Objective Data and Assessment of Progress/Goals. Patient's left shoulder range of motion has improved significantly since starting PT.     Annarose Ouellet, [place], PT 11/25/2022, 5:25 PM

## 2022-11-30 ENCOUNTER — Ambulatory Visit: Payer: 59 | Attending: Orthopaedic Surgery | Admitting: Physical Therapy

## 2022-11-30 DIAGNOSIS — M25612 Stiffness of left shoulder, not elsewhere classified: Secondary | ICD-10-CM | POA: Diagnosis present

## 2022-11-30 DIAGNOSIS — G8929 Other chronic pain: Secondary | ICD-10-CM | POA: Diagnosis present

## 2022-11-30 DIAGNOSIS — M25512 Pain in left shoulder: Secondary | ICD-10-CM | POA: Diagnosis present

## 2022-11-30 NOTE — Therapy (Signed)
OUTPATIENT PHYSICAL THERAPY SHOULDER TREATMENT Patient Name: Karina Aguirre MRN: [id number] DOB:22-Apr-1960, 62 y.o., female Today's Date: 11/30/2022  END OF SESSION:  PT End of Session - 11/30/22 1713     Visit Number 11    Number of Visits 12    Date for PT Re-Evaluation 12/07/22    Authorization Type FOTO    PT Start Time 0355    PT Stop Time 0452    PT Time Calculation (min) 57 min    Activity Tolerance Patient tolerated treatment well    Behavior During Therapy WFL for tasks assessed/performed              Past Medical History:  Diagnosis Date   GERD (gastroesophageal reflux disease)    Seizure (HCC)    Past Surgical History:  Procedure Laterality Date   BLADDER DIVERTICULECTOMY     BREAST SURGERY     x2   ELBOW SURGERY     HERNIA REPAIR     NOSE SURGERY     x2   PANCREATECTOMY     Patient Active Problem List   Diagnosis Date Noted   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  THERAPY DIAG:  Chronic left shoulder pain  Stiffness of left shoulder, not elsewhere classified  Rationale for Evaluation and Treatment: Rehabilitation  ONSET DATE: 09/06/22 (surgery date).  SUBJECTIVE:                                                                                                                                                                                      SUBJECTIVE STATEMENT: Doing good.  PERTINENT HISTORY: See  above.  PAIN:  Are you having pain? 2/10.  PRECAUTIONS: Other: Begin with PROM, PAROM to patient's left shoulder.   AAROM.   WEIGHT BEARING RESTRICTIONS:  No left UE weight bearing.  FALLS:  Has patient fallen in last 6 months? 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  NEXT MD VISIT:   OBJECTIVE:   PATIENT SURVEYS:  FOTO .  UPPER EXTREMITY ROM:   In supine:  Gentle left shoulder PROM into flexion is 75 degrees, ER is -20 degrees from neutral and abduction 40 degrees.  UPPER EXTREMITY ROM:  Passive ROM Right eval Left Eval(10/26/22) 11/04/22 11/11/22 11/18/22  Shoulder flexion  75 degrees 125 degrees 135 in supine 150 in supine  Shoulder extension       Shoulder abduction       Shoulder adduction       Shoulder extension       Shoulder internal rotation       Shoulder external rotation  -20 degrees from neutral 35 degrees 48 degrees 55 degrees.  Elbow flexion       Elbow extension       Wrist flexion       Wrist extension       Wrist ulnar deviation       Wrist radial deviation       Wrist pronation       Wrist supination        (Blank rows = not tested)  PALPATION:  Incisional sites appear to be healing very well.  Minimal palpable left shoulder tenderness.  CC is pain into her middle deltoid and occasions to elbow which appear to be referred in nature.   TODAY'S TREATMENT:                                                                                                                                         DATE:   11/30/22:                                     EXERCISE LOG  Exercise Repetitions and Resistance Comments  UBE @120  RPM's x 8 minutes (4 mins for and 4 mins back).   Pulleys 5 minutes   RW4 Yellow theraband (mini-punches moving into flexion)   Bench Press 3# to fatigue x 2.   In supine 2# ball Rhy  stabs and circles (CW and CCW) x 3 minutes   In supine:  PROM x 10 minutes to patient's left shoulder into flexion and ER f/b vasopneumatic on low x 20 minutes.   11/25/22:                                     EXERCISE LOG  Exercise Repetitions and Resistance Comments  Pulleys 5 minutes   UE Ranger on wall  5 minutes   Wall ladder 5 minutes.           In supine:  PROM to patient's left shoulder x 15 minutes with low load long duration stretching technique utilized f/b vasopneumatic on low x 20 minutes.   11/23/22:  EXERCISE LOG  Exercise Repetitions and Resistance Comments  Pulleys 5 minutes   Wall ladder 5 minutes   UE Ranger on wall 5 minutes   Shoulder abduction external rotation stretch in supine (fingers interlocked behind head) 6 reps 20 sec holds       PROM to patient's left shoulder x 14 minutes with low load long duration stretching technique utilized f/b vasopneumatic on low x 20 minutes.      PATIENT EDUCATION: Education details: See below. Person educated: Patient Education method: Explanation, Demonstration, Tactile cues, Verbal cues, and Handouts Education comprehension: verbalized understanding, returned demonstration, verbal cues required, and tactile cues required  HOME EXERCISE PROGRAM: [place] 4.  Yellow theraband.  Verbal and tactile cues required.   ASSESSMENT:  CLINICAL IMPRESSION:  The patient did well with the addition of the UBE and strengthening exercises.  She performed all exercises without compliant.  [place] 4 exercises performed with yellow theraband and the flexion movement performed in a mini-punch fashion.  OBJECTIVE IMPAIRMENTS: decreased activity tolerance, decreased ROM, and pain.   ACTIVITY LIMITATIONS: carrying, lifting, and reach over head  PARTICIPATION LIMITATIONS: meal prep, cleaning, and laundry  PERSONAL FACTORS: Time since onset of injury/illness/exacerbation are also affecting patient's  functional outcome.   REHAB POTENTIAL: Good  CLINICAL DECISION MAKING: Stable/uncomplicated  EVALUATION COMPLEXITY: Low   GOALS:  SHORT TERM GOALS: Target date: 11/09/22  Ind with an initial HEP. Goal status: INITIAL   LONG TERM GOALS: Target date: 12/07/22  Ind with an advanced HEP.  Goal status: Ongoing.  2.  Active left shoulder flexion to 145 degrees so the patient can easily reach overhead.  Goal status: Ongoing.  3.  Active ER to 70 degrees+ to allow for easily donning/doffing of apparel.  Goal status: Ongoing.  4.  Increase ROM so patient is able to reach behind back to L3.  Goal status: Ongoing.  5.  Increase left shoulder strength to a solid 4+/5 to increase stability for performance of functional activities.  Goal status: Ongoing.  6.  Perform ADL's with pain not > 3/10.  Goal status: Partially met.  PLAN:  PT FREQUENCY: 2x/week  PT DURATION: 6 weeks  PLANNED INTERVENTIONS: Therapeutic exercises, Therapeutic activity, Neuromuscular re-education, Patient/Family education, Self Care, Electrical stimulation, Cryotherapy, Moist heat, Vasopneumatic device, Ultrasound, and Manual therapy  PLAN FOR NEXT SESSION: UBE, yellow theraband RW4.      Dylen Mcelhannon, [place], PT 11/30/2022, 5:28 PM

## 2022-12-02 ENCOUNTER — Ambulatory Visit: Payer: 59 | Admitting: Physical Therapy

## 2022-12-02 DIAGNOSIS — M25512 Pain in left shoulder: Secondary | ICD-10-CM | POA: Diagnosis not present

## 2022-12-02 DIAGNOSIS — M25612 Stiffness of left shoulder, not elsewhere classified: Secondary | ICD-10-CM

## 2022-12-02 DIAGNOSIS — G8929 Other chronic pain: Secondary | ICD-10-CM

## 2022-12-02 NOTE — Therapy (Signed)
OUTPATIENT PHYSICAL THERAPY SHOULDER TREATMENT Patient Name: Karina Aguirre MRN: [id number] DOB:1960/10/05, 62 y.o., female Today's Date: 12/02/2022  END OF SESSION:  PT End of Session - 12/02/22 1746     Visit Number 12    Number of Visits 12    Date for PT Re-Evaluation 12/07/22    Authorization Type FOTO    PT Start Time 0400    PT Stop Time 0458    PT Time Calculation (min) 58 min    Activity Tolerance Patient tolerated treatment well    Behavior During Therapy WFL for tasks assessed/performed               Past Medical History:  Diagnosis Date   GERD (gastroesophageal reflux disease)    Seizure (HCC)    Past Surgical History:  Procedure Laterality Date   BLADDER DIVERTICULECTOMY     BREAST SURGERY     x2   ELBOW SURGERY     HERNIA REPAIR     NOSE SURGERY     x2   PANCREATECTOMY     Patient Active Problem List   Diagnosis Date Noted   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  THERAPY DIAG:  Chronic left shoulder pain  Stiffness of left shoulder, not elsewhere classified  Rationale for Evaluation and Treatment: Rehabilitation  ONSET DATE: 09/06/22 (surgery date).  SUBJECTIVE:                                                                                                                                                                                      SUBJECTIVE STATEMENT: Doing good.  PERTINENT HISTORY: See  above.  PAIN:  Are you having pain? 2/10.  PRECAUTIONS: Other: Begin with PROM, PAROM to patient's left shoulder.   AAROM.   WEIGHT BEARING RESTRICTIONS:  No left UE weight bearing.  FALLS:  Has patient fallen in last 6 months? 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  NEXT MD VISIT:   OBJECTIVE:   PATIENT SURVEYS:  FOTO .  UPPER EXTREMITY ROM:   In supine:  Gentle left shoulder PROM into flexion is 75 degrees, ER is -20 degrees from neutral and abduction 40 degrees.  UPPER EXTREMITY ROM:  Passive ROM Right eval Left Eval(10/26/22) 11/04/22 11/11/22 11/18/22  Shoulder flexion  75 degrees 125 degrees 135 in supine 150 in supine  Shoulder extension       Shoulder abduction       Shoulder adduction       Shoulder extension       Shoulder internal rotation       Shoulder external rotation  -20 degrees from neutral 35 degrees 48 degrees 55 degrees.  Elbow flexion       Elbow extension       Wrist flexion       Wrist extension       Wrist ulnar deviation       Wrist radial deviation       Wrist pronation       Wrist supination        (Blank rows = not tested)  PALPATION:  Incisional sites appear to be healing very well.  Minimal palpable left shoulder tenderness.  CC is pain into her middle deltoid and occasions to elbow which appear to be referred in nature.   TODAY'S TREATMENT:                                                                                                                                         DATE:   12/02/22:                                    EXERCISE LOG  Exercise Repetitions and Resistance Comments  UBE 10 minutes at 120 RPM's (5 mins for and 5 mins back)   Pulleys 5 minutes   RW4 Yellow band to fatigue.   SDLY(Right) ER 1# to fatigue x 2.       PROM x 10 minutes f/b vasopneumatic on low  x 20 minutes.    11/30/22:                                     EXERCISE LOG  Exercise Repetitions and Resistance Comments  UBE @120  RPM's x 8 minutes (4 mins for and 4 mins back).   Pulleys 5 minutes   RW4 Yellow theraband (mini-punches moving into flexion)   Bench Press 3# to fatigue x 2.   In supine 2# ball Rhy stabs and circles (CW and CCW) x 3 minutes   In supine:  PROM x 10 minutes to patient's left shoulder into flexion and ER f/b vasopneumatic on low x 20 minutes.   11/25/22:  EXERCISE LOG  Exercise Repetitions and Resistance Comments  Pulleys 5 minutes   UE Ranger on wall  5 minutes   Wall ladder 5 minutes.           In supine:  PROM to patient's left shoulder x 15 minutes with low load long duration stretching technique utilized f/b vasopneumatic on low x 20 minutes.   11/23/22:                                   EXERCISE LOG  Exercise Repetitions and Resistance Comments  Pulleys 5 minutes   Wall ladder 5 minutes   UE Ranger on wall 5 minutes   Shoulder abduction external rotation stretch in supine (fingers interlocked behind head) 6 reps 20 sec holds       PROM to patient's left shoulder x 14 minutes with low load long duration stretching technique utilized f/b vasopneumatic on low x 20 minutes.      PATIENT EDUCATION: Education details: See below. Person educated: Patient Education method: Explanation, Demonstration, Tactile cues, Verbal cues, and Handouts Education comprehension: verbalized understanding, returned demonstration, verbal cues required, and tactile cues required  HOME EXERCISE PROGRAM: HOME EXERCISE PROGRAM Created by [place] Deniro Laymon Oct 3rd, 2024 View at [email or web address] using code: [email or web address]  Page 1 of 1 1 Exercise Sidelying ER Start: Position yourself on your side, place a towel between your elbow and your body, Position your arm at 90 degrees as pictured Movement: Keeping your arm at 90 degrees,  raise your forearm towards the ceiling slowly and controlled return to start position. *Use a free weight as needed. 1# soup can Repeat 15 Times Complete 2 Sets Perform 2 Times a Day   ASSESSMENT:  CLINICAL IMPRESSION:  Added sdly er with 1# which she is performing better than standing er with yellow theraband.  She is doing very well overall and progressing well toward goals.  OBJECTIVE IMPAIRMENTS: decreased activity tolerance, decreased ROM, and pain.   ACTIVITY LIMITATIONS: carrying, lifting, and reach over head  PARTICIPATION LIMITATIONS: meal prep, cleaning, and laundry  PERSONAL FACTORS: Time since onset of injury/illness/exacerbation are also affecting patient's functional outcome.   REHAB POTENTIAL: Good  CLINICAL DECISION MAKING: Stable/uncomplicated  EVALUATION COMPLEXITY: Low   GOALS:  SHORT TERM GOALS: Target date: 11/09/22  Ind with an initial HEP. Goal status: INITIAL   LONG TERM GOALS: Target date: 12/07/22  Ind with an advanced HEP.  Goal status: Ongoing.  2.  Active left shoulder flexion to 145 degrees so the patient can easily reach overhead.  Goal status: Ongoing.  3.  Active ER to 70 degrees+ to allow for easily donning/doffing of apparel.  Goal status: Ongoing.  4.  Increase ROM so patient is able to reach behind back to L3.  Goal status: Ongoing.  5.  Increase left shoulder strength to a solid 4+/5 to increase stability for performance of functional activities.  Goal status: Ongoing.  6.  Perform ADL's with pain not > 3/10.  Goal status: Partially met.  PLAN:  PT FREQUENCY: 2x/week  PT DURATION: 6 weeks  PLANNED INTERVENTIONS: Therapeutic exercises, Therapeutic activity, Neuromuscular re-education, Patient/Family education, Self Care, Electrical stimulation, Cryotherapy, Moist heat, Vasopneumatic device, Ultrasound, and Manual therapy  PLAN FOR NEXT SESSION: UBE, yellow theraband RW4.      Jariah Tarkowski, [place], PT 12/02/2022, 5:48 PM

## 2022-12-07 ENCOUNTER — Ambulatory Visit: Payer: 59 | Admitting: Physical Therapy

## 2022-12-07 DIAGNOSIS — M25512 Pain in left shoulder: Secondary | ICD-10-CM | POA: Diagnosis not present

## 2022-12-07 DIAGNOSIS — G8929 Other chronic pain: Secondary | ICD-10-CM

## 2022-12-07 DIAGNOSIS — M25612 Stiffness of left shoulder, not elsewhere classified: Secondary | ICD-10-CM

## 2022-12-07 NOTE — Therapy (Addendum)
OUTPATIENT PHYSICAL THERAPY SHOULDER TREATMENT Patient Name: Karina Aguirre MRN: [id number] DOB:1960/04/10, 62 y.o., female Today's Date: 12/07/2022  END OF SESSION:  PT End of Session - 12/07/22 1616     Visit Number 13    Number of Visits 18    Date for PT Re-Evaluation 12/28/22    Authorization Type FOTO    PT Start Time 0355    PT Stop Time 0448    PT Time Calculation (min) 53 min    Activity Tolerance Patient tolerated treatment well    Behavior During Therapy WFL for tasks assessed/performed               Past Medical History:  Diagnosis Date   GERD (gastroesophageal reflux disease)    Seizure (HCC)    Past Surgical History:  Procedure Laterality Date   BLADDER DIVERTICULECTOMY     BREAST SURGERY     x2   ELBOW SURGERY     HERNIA REPAIR     NOSE SURGERY     x2   PANCREATECTOMY     Patient Active Problem List   Diagnosis Date Noted   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  THERAPY DIAG:  Chronic left shoulder pain - Plan: PT plan of care cert/re-cert  Stiffness of left shoulder, not elsewhere classified - Plan: PT plan of care cert/re-cert  Rationale for Evaluation and Treatment: Rehabilitation  ONSET DATE: 09/06/22 (surgery date).  SUBJECTIVE:                                                                                                                                                                                       SUBJECTIVE STATEMENT: Sore from last treatment.  Around a 4.  PERTINENT HISTORY: See above.  PAIN:  Are you having pain? 4/10.  PRECAUTIONS: Other: Begin with PROM, PAROM to patient's left shoulder.   AAROM.   WEIGHT BEARING RESTRICTIONS:  No left UE weight bearing.  FALLS:  Has patient fallen in last 6 months? 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  NEXT MD VISIT:   OBJECTIVE:   PATIENT SURVEYS:  FOTO .   UPPER EXTREMITY ROM:   In supine:  Gentle left shoulder PROM into flexion is 75 degrees, ER is -20 degrees from neutral and abduction 40 degrees.  UPPER EXTREMITY ROM:  Passive ROM Right eval Left Eval(10/26/22) 11/04/22 11/11/22 11/18/22 12/07/22  Shoulder flexion  75 degrees 125 degrees 135 in supine 150 in supine Standing 150 degrees  Shoulder extension        Shoulder abduction        Shoulder adduction        Shoulder extension        Shoulder internal rotation        Shoulder external rotation  -20 degrees from neutral 35 degrees 48 degrees 55 degrees. Standing with back against wall 70 degrees  Elbow flexion        Elbow extension        Wrist flexion        Wrist extension        Wrist ulnar deviation        Wrist radial deviation        Wrist pronation        Wrist supination         (Blank rows = not tested)  PALPATION:  Incisional sites appear to be healing very well.  Minimal palpable left shoulder tenderness.  CC is pain into her middle deltoid and occasions to elbow which appear to be referred in nature.   TODAY'S TREATMENT:                                                                                                                                         DATE:   12/07/22:                                     EXERCISE LOG  Exercise Repetitions and Resistance Comments  UBE 10 minutes        Pulleys 5 minutes   Wall ladder 5 minutes   Abd 1# to fatigue   Full can 1# to fatigue   flex 1# to fatigue   ER at 45 degrees of abd 1# to fatigue   Vasopneumatic on low x 20 minutes.   12/02/22:                                    EXERCISE LOG  Exercise Repetitions and Resistance Comments  UBE 10 minutes at 120 RPM's (5 mins for and 5 mins back)   Pulleys 5 minutes   RW4 Yellow band to fatigue.   SDLY(Right) ER 1# to fatigue x 2.       PROM  x 10 minutes f/b vasopneumatic on low x 20 minutes.    11/30/22:                                     EXERCISE LOG  Exercise Repetitions and Resistance Comments  UBE @120  RPM's x 8 minutes (4 mins for and 4 mins back).   Pulleys 5 minutes   RW4 Yellow theraband (mini-punches moving into flexion)   Bench Press 3# to fatigue x 2.   In supine 2# ball Rhy stabs and circles (CW and CCW) x 3 minutes   In supine:  PROM x 10 minutes to patient's left shoulder into flexion and ER f/b vasopneumatic on low x 20 minutes.   11/25/22:                                     EXERCISE LOG  Exercise Repetitions and Resistance Comments  Pulleys 5 minutes   UE Ranger on wall  5 minutes   Wall ladder 5 minutes.           In supine:  PROM to patient's left shoulder x 15 minutes with low load long duration stretching technique utilized f/b vasopneumatic on low x 20 minutes.   11/23/22:                                   EXERCISE LOG  Exercise Repetitions and Resistance Comments  Pulleys 5 minutes   Wall ladder 5 minutes   UE Ranger on wall 5 minutes   Shoulder abduction external rotation stretch in supine (fingers interlocked behind head) 6 reps 20 sec holds       PROM to patient's left shoulder x 14 minutes with low load long duration stretching technique utilized f/b vasopneumatic on low x 20 minutes.      PATIENT EDUCATION: Education details: See below. Person educated: Patient Education method: Explanation, Demonstration, Tactile cues,  Verbal cues, and Handouts Education comprehension: verbalized understanding, returned demonstration, verbal cues required, and tactile cues required  HOME EXERCISE PROGRAM: HOME EXERCISE PROGRAM Created by [place] Marquelle Musgrave Oct 3rd, 2024 View at [email or web address] using code: [email or web address]  Page 1 of 1 1 Exercise Sidelying ER Start: Position yourself on your side, place a towel between your elbow and your body, Position your arm at 90 degrees as pictured Movement: Keeping your arm at 90 degrees, raise your forearm towards the ceiling slowly and controlled return to start position. *Use a free weight as needed. 1# soup can Repeat 15 Times Complete 2 Sets Perform 2 Times a Day   ASSESSMENT:  CLINICAL IMPRESSION:  Excellent progress toward goals.  Patient exhibiting standing anti-gravity left shoulder flexion to 150 degrees and ER (with back against wall) to 70 degrees.  She is now beginning some light strengthening and is compliant to her HEP.  OBJECTIVE IMPAIRMENTS: decreased activity tolerance, decreased ROM, and pain.   ACTIVITY LIMITATIONS: carrying, lifting, and reach over head  PARTICIPATION LIMITATIONS: meal prep, cleaning, and laundry  PERSONAL FACTORS: Time since onset of injury/illness/exacerbation are also affecting patient's functional outcome.   REHAB POTENTIAL: Good  CLINICAL DECISION MAKING: Stable/uncomplicated  EVALUATION COMPLEXITY: Low   GOALS:  SHORT TERM GOALS: Target date: 11/09/22  Ind with an initial HEP. Goal status: INITIAL   LONG  TERM GOALS: Target date: 12/07/22  Ind with an advanced HEP.  Goal status: Ongoing.  2.  Active left shoulder flexion to 145 degrees so the patient can easily reach overhead.  Goal status: Ongoing.  3.  Active ER to 70 degrees+ to allow for easily donning/doffing of apparel.  Goal status: Ongoing.  4.  Increase ROM so patient is able to reach behind back to L3.  Goal status: Ongoing.  5.  Increase left shoulder  strength to a solid 4+/5 to increase stability for performance of functional activities.  Goal status: Ongoing.  6.  Perform ADL's with pain not > 3/10.  Goal status: Partially met.  PLAN:  PT FREQUENCY: 2x/week  PT DURATION: 6 weeks  PLANNED INTERVENTIONS: Therapeutic exercises, Therapeutic activity, Neuromuscular re-education, Patient/Family education, Self Care, Electrical stimulation, Cryotherapy, Moist heat, Vasopneumatic device, Ultrasound, and Manual therapy  PLAN FOR NEXT SESSION: UBE, yellow theraband RW4.      Jakobie Henslee, [place], PT 12/07/2022, 5:37 PM

## 2022-12-08 ENCOUNTER — Ambulatory Visit: Payer: 59 | Admitting: Orthopaedic Surgery

## 2022-12-08 DIAGNOSIS — M7522 Bicipital tendinitis, left shoulder: Secondary | ICD-10-CM

## 2022-12-08 DIAGNOSIS — M75112 Incomplete rotator cuff tear or rupture of left shoulder, not specified as traumatic: Secondary | ICD-10-CM

## 2022-12-08 NOTE — Progress Notes (Signed)
Post-Op Visit Note   Patient: Karina Aguirre           Date of Birth: Jul 15, 1960           MRN: [id number] Visit Date: 12/08/2022 PCP: Carylon Perches, MD   Assessment & Plan: Patient turned she get arm above her head she is going to therapy twice a week.  She would like to go back to work on 12/22/2022 work slip given.  I will plan to check her back in 2 months.  Recently she has had some increased problems with her lumbar spine MRI scan in April showed L2-3 rapid disc degeneration with endplate edema more consistent with degenerative changes.  There is some disc bulge on the left corresponds with her left more than right side symptoms.  Today she states she is doing better but she has had a few days when she had trouble getting out of bed and trouble walking.  She had 1 injection last year she states it helped some but she will call and let us know she wants to repeat the lumbar injection.  Otherwise recheck 2 months.  Chief Complaint:  Chief Complaint  Patient presents with   Left Shoulder - Routine Post Op    Therapy hurts, but it is helping. Takes Tylenol occasionally   Visit Diagnoses:  1. 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 Tendinitis of upper biceps tendon of left shoulder     Plan: Recheck 2 months.  She is happy she can get her arm up overhead with minimal discomfort.  Follow-Up Instructions: Return in about 2 months (around 02/07/2023).   Orders:  No orders of the defined types were placed in this encounter.  No orders of the defined types were placed in this encounter.   Imaging: No results found.  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 08/04/2022   Tendinitis of upper biceps tendon of left shoulder 08/04/2022   Other intervertebral disc degeneration, lumbar region 06/23/2022   Impingement syndrome of left shoulder 05/26/2022   Trochanteric bursitis, left hip 12/25/2021    Protrusion of lumbar intervertebral disc 10/01/2021   Carpal tunnel syndrome of right wrist 06/21/2016   Pain in left ankle and joints of left foot 01/15/2016   CHEST PAIN 11/07/2009   Past Medical History:  Diagnosis Date   GERD (gastroesophageal reflux disease)    Seizure (HCC)     Family History  Problem Relation Age of Onset   Hypertension Father     Past Surgical History:  Procedure Laterality Date   BLADDER DIVERTICULECTOMY     BREAST SURGERY     x2   ELBOW SURGERY     HERNIA REPAIR     NOSE SURGERY     x2   PANCREATECTOMY     Social History   Occupational History   Not on file  Tobacco Use   Smoking status: Never   Smokeless tobacco: Never  Substance and Sexual Activity   Alcohol use: No   Drug use: No   Sexual activity: Not on file

## 2022-12-09 ENCOUNTER — Encounter: Payer: 59 | Admitting: Physical Therapy

## 2022-12-14 ENCOUNTER — Ambulatory Visit: Payer: 59 | Admitting: Physical Therapy

## 2022-12-14 DIAGNOSIS — M25612 Stiffness of left shoulder, not elsewhere classified: Secondary | ICD-10-CM

## 2022-12-14 DIAGNOSIS — G8929 Other chronic pain: Secondary | ICD-10-CM

## 2022-12-14 DIAGNOSIS — M25512 Pain in left shoulder: Secondary | ICD-10-CM | POA: Diagnosis not present

## 2022-12-14 NOTE — Therapy (Signed)
OUTPATIENT PHYSICAL THERAPY SHOULDER TREATMENT Patient Name: Karina Aguirre MRN: [id number] DOB:1960-03-16, 62 y.o., female Today's Date: 12/14/2022  END OF SESSION:  PT End of Session - 12/14/22 1600     Visit Number 14    Number of Visits 18    Date for PT Re-Evaluation 12/28/22    Authorization Type FOTO    PT Start Time 0354    Activity Tolerance Patient tolerated treatment well    Behavior During Therapy WFL for tasks assessed/performed               Past Medical History:  Diagnosis Date   GERD (gastroesophageal reflux disease)    Seizure (HCC)    Past Surgical History:  Procedure Laterality Date   BLADDER DIVERTICULECTOMY     BREAST SURGERY     x2   ELBOW SURGERY     HERNIA REPAIR     NOSE SURGERY     x2   PANCREATECTOMY     Patient Active Problem List   Diagnosis Date Noted   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  THERAPY DIAG:  Chronic left shoulder pain  Stiffness of left shoulder, not elsewhere classified  Rationale for Evaluation and Treatment: Rehabilitation  ONSET DATE: 09/06/22 (surgery date).  SUBJECTIVE:                                                                                                                                                                                      SUBJECTIVE STATEMENT: Sore.  PERTINENT HISTORY: See above.  PAIN:  Are you having pain? "Sore"/10.  PRECAUTIONS: Other:  Begin with PROM, PAROM to patient's left shoulder.   AAROM.   WEIGHT BEARING RESTRICTIONS:  No left UE weight bearing.  FALLS:  Has patient fallen in last 6 months? 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  NEXT MD VISIT:   OBJECTIVE:   PATIENT SURVEYS:  FOTO .   UPPER EXTREMITY ROM:   In supine:  Gentle left shoulder PROM into flexion is  75 degrees, ER is -20 degrees from neutral and abduction 40 degrees.  UPPER EXTREMITY ROM:  Passive ROM Right eval Left Eval(10/26/22) 11/04/22 11/11/22 11/18/22 12/07/22  Shoulder flexion  75 degrees 125 degrees 135 in supine 150 in supine Standing 150 degrees  Shoulder extension        Shoulder abduction        Shoulder adduction        Shoulder extension        Shoulder internal rotation        Shoulder external rotation  -20 degrees from neutral 35 degrees 48 degrees 55 degrees. Standing with back against wall 70 degrees  Elbow flexion        Elbow extension        Wrist flexion        Wrist extension        Wrist ulnar deviation        Wrist radial deviation        Wrist pronation        Wrist supination         (Blank rows = not tested)  PALPATION:  Incisional sites appear to be healing very well.  Minimal palpable left shoulder tenderness.  CC is pain into her middle deltoid and occasions to elbow which appear to be referred in nature.   TODAY'S TREATMENT:                                                                                                                                         DATE:   12/14/22:                                     EXERCISE LOG  Exercise Repetitions and Resistance Comments  UBE @120  RPM's x 10 minutes.   Pulleys 5 minutes   XTS Seated lat pulldown with green band to fatigue x 2.   XTS Shoulder extension to fatigue x 2    XTS  Punches with green  band to fatigue x 2   Bicep curls 3# to fatigue x 2   Supine triceps 3# to fatigue x 2   Towel stretch (behind back) 4 reps 30 sec holds.   Vasopneumatic on low x 20 minutes to patient's left shoulder.  12/07/22:                                     EXERCISE LOG  Exercise Repetitions and Resistance Comments  UBE 10 minutes       Pulleys 5 minutes   Wall ladder 5 minutes   Abd 1# to fatigue   Full can 1# to fatigue   flex 1# to fatigue   ER at 45 degrees of abd 1# to  fatigue   Vasopneumatic on low x 20 minutes.   12/02/22:                                    EXERCISE LOG  Exercise Repetitions and Resistance Comments  UBE 10 minutes at 120 RPM's (5 mins for and 5 mins back)   Pulleys 5 minutes   RW4 Yellow band to fatigue.   SDLY(Right) ER 1# to fatigue x 2.       PROM x 10 minutes f/b vasopneumatic on low x 20 minutes.                         PATIENT EDUCATION: Education details: See below. Person educated: Patient Education method: Explanation, Demonstration, Tactile cues, Verbal cues, and Handouts Education comprehension: verbalized understanding, returned demonstration, verbal cues required, and tactile cues required  HOME EXERCISE PROGRAM:  HOME EXERCISE PROGRAM Created by [place] Myron Stankovich Oct 15th, 2024 View at [email or web address] using code: [email or web address]  Page 1 of 2 6 Exercises FREE WEIGHT - ALTERNATE FLEXION PALMS DOWN While holding dumbbells with your elbows straight and down by your side, slowly raise them up in front of your body with your palms facing downward. Then return to starting position and repeat. Do not let your shoulder shrug upwards unless instructed to go over shoulder level height. Repeat 15 Times Hold 1 Second Complete 2 Sets Perform 1 Time a Day Shoulder Abduction to 90 Degrees Stand with arm at side, elbow straight, and palm against side. Raise arm to the side, palm down, until arm reaches 90 degrees (shoulder level). Repeat 15  Times Complete 2 Sets Perform 2 Times a Day BICEP CURLS With your arm at your side, bend at your elbow to raise up the free weight / dumbbell. Lower back down and repeat.  Keep your palm face up the entire time. Repeat 15 Times Hold 1 Second Complete 2 Sets Perform 2 Times a Day  ASSESSMENT:  CLINICAL IMPRESSION:  The patient returned to her MD and he was very pleased with her progress.  Reduced her frequency to one time a week.  She did a great job today with additional exercises performing with very good technique with verbal cues.  OBJECTIVE IMPAIRMENTS: decreased activity tolerance, decreased ROM, and pain.   ACTIVITY LIMITATIONS: carrying, lifting, and reach over head  PARTICIPATION LIMITATIONS: meal prep, cleaning, and laundry  PERSONAL FACTORS: Time since onset of injury/illness/exacerbation are also affecting patient's functional outcome.   REHAB POTENTIAL: Good  CLINICAL DECISION MAKING: Stable/uncomplicated  EVALUATION COMPLEXITY: Low   GOALS:  SHORT TERM GOALS: Target date: 11/09/22  Ind with an initial HEP. Goal status: INITIAL   LONG TERM GOALS: Target date: 12/07/22  Ind with an advanced HEP.  Goal status: Ongoing.  2.  Active left shoulder flexion to 145 degrees so the patient can easily reach overhead.  Goal status: Ongoing.  3.  Active ER to 70 degrees+ to allow for easily donning/doffing of apparel.  Goal status: Ongoing.  4.  Increase ROM so patient is able to reach behind back to L3.  Goal status: Ongoing.  5.  Increase left shoulder strength to a solid 4+/5 to increase stability for performance of functional activities.  Goal status: Ongoing.  6.  Perform ADL's with pain not > 3/10.  Goal status: Partially met.  PLAN:  PT FREQUENCY: 2x/week  PT DURATION: 6 weeks  PLANNED  INTERVENTIONS: Therapeutic exercises, Therapeutic activity, Neuromuscular re-education, Patient/Family education, Self Care, Electrical stimulation, Cryotherapy, Moist  heat, Vasopneumatic device, Ultrasound, and Manual therapy  PLAN FOR NEXT SESSION: UBE, yellow theraband RW4.      Graciella Arment, [place], PT 12/14/2022, 4:01 PM

## 2022-12-21 ENCOUNTER — Ambulatory Visit: Payer: 59 | Admitting: Physical Therapy

## 2022-12-21 ENCOUNTER — Encounter: Payer: Self-pay | Admitting: Physical Therapy

## 2022-12-21 DIAGNOSIS — G8929 Other chronic pain: Secondary | ICD-10-CM

## 2022-12-21 DIAGNOSIS — M25612 Stiffness of left shoulder, not elsewhere classified: Secondary | ICD-10-CM

## 2022-12-21 DIAGNOSIS — M25512 Pain in left shoulder: Secondary | ICD-10-CM | POA: Diagnosis not present

## 2022-12-21 NOTE — Therapy (Signed)
OUTPATIENT PHYSICAL THERAPY SHOULDER TREATMENT Patient Name: Karina Aguirre MRN: [id number] DOB:1960/05/13, 62 y.o., female Today's Date: 12/21/2022  END OF SESSION:  PT End of Session - 12/21/22 1706     Visit Number 15    Number of Visits 18    Date for PT Re-Evaluation 12/28/22    Authorization Type FOTO    PT Start Time 0357    PT Stop Time 0458    PT Time Calculation (min) 61 min    Activity Tolerance Patient tolerated treatment well    Behavior During Therapy WFL for tasks assessed/performed                Past Medical History:  Diagnosis Date   GERD (gastroesophageal reflux disease)    Seizure (HCC)    Past Surgical History:  Procedure Laterality Date   BLADDER DIVERTICULECTOMY     BREAST SURGERY     x2   ELBOW SURGERY     HERNIA REPAIR     NOSE SURGERY     x2   PANCREATECTOMY     Patient Active Problem List   Diagnosis Date Noted   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  THERAPY DIAG:  Chronic left shoulder pain  Stiffness of left shoulder, not elsewhere classified  Rationale for Evaluation and Treatment: Rehabilitation  ONSET DATE: 09/06/22 (surgery date).  SUBJECTIVE:                                                                                                                                                                                      SUBJECTIVE STATEMENT: Sore.  PERTINENT HISTORY: See  above.  PAIN:  Are you having pain? "Sore"/10.  PRECAUTIONS: Other: Begin with PROM, PAROM to patient's left shoulder.   AAROM.   WEIGHT BEARING RESTRICTIONS:  No left UE weight bearing.  FALLS:  Has patient fallen in last 6 months? 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  NEXT MD VISIT:   OBJECTIVE:   PATIENT SURVEYS:  FOTO .  UPPER EXTREMITY ROM:   In supine:  Gentle left shoulder PROM into flexion is 75 degrees, ER is -20 degrees from neutral and abduction 40 degrees.  UPPER EXTREMITY ROM:  Passive ROM Right eval Left Eval(10/26/22) 11/04/22 11/11/22 11/18/22 12/07/22  Shoulder flexion  75 degrees 125 degrees 135 in supine 150 in supine Standing 150 degrees  Shoulder extension        Shoulder abduction        Shoulder adduction        Shoulder extension        Shoulder internal rotation        Shoulder external rotation  -20 degrees from neutral 35 degrees 48 degrees 55 degrees. Standing with back against wall 70 degrees  Elbow flexion        Elbow extension        Wrist flexion        Wrist extension        Wrist ulnar deviation        Wrist radial deviation        Wrist pronation        Wrist supination         (Blank rows = not tested)  PALPATION:  Incisional sites appear to be healing very well.  Minimal palpable left shoulder tenderness.  CC is pain into her middle deltoid and occasions to elbow which appear to be referred in nature.   TODAY'S TREATMENT:                                                                                                                                         DATE:   12/21/22:                                       EXERCISE LOG  Exercise Repetitions and Resistance Comments  UBE 90 RPM's x 10 minutes   Pulleys 5 minutes   UE Ranger 2# x 5 minutes   Left shoulder ER at 90  abduction 3 sets to fatigue   Bicep curls 5# to fatigue x 2   flex, abd and full can 2# to fatigue x 2    Overhead punches 3# to fatigue x 2.   Prone rows 3# to fatigue x 2.   Bent shoulder ext 3# to fatigue x 2.   Vasopneumatic on low x 20 minutes to patient's left shoulder.  12/14/22:                                     EXERCISE LOG  Exercise Repetitions and Resistance Comments  UBE @120  RPM's x 10 minutes.   Pulleys 5 minutes   XTS Seated lat pulldown with green band to fatigue x 2.  XTS Shoulder extension to fatigue x 2    XTS  Punches with green band to fatigue x 2   Bicep curls 3# to fatigue x 2   Supine triceps 3# to fatigue x 2   Towel stretch (behind back) 4 reps 30 sec holds.   Vasopneumatic on low x 20 minutes to patient's left shoulder.  12/07/22:                                     EXERCISE LOG  Exercise Repetitions and Resistance Comments  UBE 10 minutes       Pulleys 5 minutes   Wall ladder 5 minutes   Abd 1# to fatigue   Full can 1# to fatigue   flex 1# to fatigue   ER at 45 degrees of abd 1# to fatigue   Vasopneumatic on low x 20 minutes.   12/02/22:                                    EXERCISE LOG  Exercise Repetitions and Resistance Comments  UBE 10 minutes at 120 RPM's (5 mins for and 5 mins back)   Pulleys 5 minutes   RW4 Yellow band to fatigue.   SDLY(Right) ER 1# to fatigue x 2.       PROM x 10 minutes f/b vasopneumatic on low x 20 minutes.                         PATIENT EDUCATION: Education details: See below. Person educated: Patient Education method: Explanation, Demonstration, Tactile cues, Verbal cues, and Handouts Education comprehension: verbalized understanding, returned demonstration, verbal cues required, and tactile cues required  HOME EXERCISE PROGRAM:   HOME EXERCISE PROGRAM Created by [place] Stepanie Graver Oct 22nd, 2024 View at [email or web address] using code: [email or web address]  Page 1 of 1 2 Exercises PRONE ROW OAZ While  standing, bend over and support yourself with your unaffected arm by placing your hand on the table. Start with your affected arm hanging straight down. Then raise your elbow back and up, allowing your arm to bend. Focus on squeezing your shoulder blade towards your spine on the way up. Repeat 15 Times Hold 2 Seconds Complete 2 Sets Perform 2 Times a Day Bent over shoulder extension With your left leg forward with the green band. Extend your shoulder backwards, squeezing your left shoulder blade to engage/start the movement. Perform 15 repetitions. 2 sets. 3x/week Repeat 15 Times Hold 1 Second Complete 2 Sets Perform 2 Times a Day   ASSESSMENT:  CLINICAL IMPRESSION:  The patient is highly motivated and has been very compliant to her HEP.  She performed a comprehensive left UE strengthening program today with excellent technique.  She has purchased dumbbells for home use.    OBJECTIVE IMPAIRMENTS: decreased activity tolerance, decreased ROM, and pain.   ACTIVITY LIMITATIONS: carrying, lifting, and reach over head  PARTICIPATION LIMITATIONS: meal prep, cleaning, and laundry  PERSONAL FACTORS: Time since onset of injury/illness/exacerbation are also affecting patient's functional outcome.   REHAB POTENTIAL: Good  CLINICAL DECISION MAKING: Stable/uncomplicated  EVALUATION COMPLEXITY: Low   GOALS:  SHORT TERM GOALS: Target date: 11/09/22  Ind with an initial HEP. Goal status: INITIAL   LONG TERM GOALS: Target date: 12/07/22  Ind with an advanced HEP.  Goal status: Ongoing.  2.  Active left shoulder flexion to 145 degrees so the patient can easily reach overhead.  Goal status: Ongoing.  3.  Active ER to 70 degrees+ to allow for easily donning/doffing of apparel.  Goal status: Ongoing.  4.  Increase ROM so patient is able to reach behind back to L3.  Goal status: Ongoing.  5.  Increase left shoulder strength to a solid 4+/5 to increase stability for performance of  functional activities.  Goal status: Ongoing.  6.  Perform ADL's with pain not > 3/10.  Goal status: Partially met.  PLAN:  PT FREQUENCY: 2x/week  PT DURATION: 6 weeks  PLANNED INTERVENTIONS: Therapeutic exercises, Therapeutic activity, Neuromuscular re-education, Patient/Family education, Self Care, Electrical stimulation, Cryotherapy, Moist heat, Vasopneumatic device, Ultrasound, and Manual therapy  PLAN FOR NEXT SESSION: PRE.     Kaho Selle, [place], PT 12/21/2022, 5:15 PM

## 2022-12-28 ENCOUNTER — Ambulatory Visit: Payer: 59 | Admitting: Physical Therapy

## 2022-12-28 ENCOUNTER — Encounter: Payer: Self-pay | Admitting: Physical Therapy

## 2022-12-28 DIAGNOSIS — M25512 Pain in left shoulder: Secondary | ICD-10-CM | POA: Diagnosis not present

## 2022-12-28 DIAGNOSIS — G8929 Other chronic pain: Secondary | ICD-10-CM

## 2022-12-28 DIAGNOSIS — M25612 Stiffness of left shoulder, not elsewhere classified: Secondary | ICD-10-CM

## 2022-12-28 NOTE — Therapy (Signed)
OUTPATIENT PHYSICAL THERAPY SHOULDER TREATMENT Patient Name: Karina Aguirre MRN: [id number] DOB:10-08-1960, 62 y.o., female Today's Date: 12/28/2022  END OF SESSION:  PT End of Session - 12/28/22 1627     Visit Number 16    Number of Visits 18    Date for PT Re-Evaluation 12/28/22    Authorization Type FOTO    PT Start Time 0350    PT Stop Time 0423    PT Time Calculation (min) 33 min    Activity Tolerance Patient tolerated treatment well    Behavior During Therapy WFL for tasks assessed/performed                Past Medical History:  Diagnosis Date   GERD (gastroesophageal reflux disease)    Seizure (HCC)    Past Surgical History:  Procedure Laterality Date   BLADDER DIVERTICULECTOMY     BREAST SURGERY     x2   ELBOW SURGERY     HERNIA REPAIR     NOSE SURGERY     x2   PANCREATECTOMY     Patient Active Problem List   Diagnosis Date Noted   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  THERAPY DIAG:  Chronic left shoulder pain  Stiffness of left shoulder, not elsewhere classified  Rationale for Evaluation and Treatment: Rehabilitation  ONSET DATE: 09/06/22 (surgery date).  SUBJECTIVE:                                                                                                                                                                                      SUBJECTIVE STATEMENT: No new complaints.  PERTINENT HISTORY: See  above.  PAIN:  Are you having pain? Low/10.  PRECAUTIONS: Other: Begin with PROM, PAROM to patient's left shoulder.   AAROM.   WEIGHT BEARING RESTRICTIONS:  No left UE weight bearing.  FALLS:  Has patient fallen in last 6 months? 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  NEXT MD VISIT:   OBJECTIVE:   PATIENT SURVEYS:  FOTO .   UPPER EXTREMITY ROM:   In supine:  Gentle left shoulder PROM into flexion is 75 degrees, ER is -20 degrees from neutral and abduction 40 degrees.  UPPER EXTREMITY ROM:  Passive ROM Right eval Left Eval(10/26/22) 11/04/22 11/11/22 11/18/22 12/07/22  Shoulder flexion  75 degrees 125 degrees 135 in supine 150 in supine Standing 150 degrees  Shoulder extension        Shoulder abduction        Shoulder adduction        Shoulder extension        Shoulder internal rotation        Shoulder external rotation  -20 degrees from neutral 35 degrees 48 degrees 55 degrees. Standing with back against wall 70 degrees  Elbow flexion        Elbow extension        Wrist flexion        Wrist extension        Wrist ulnar deviation        Wrist radial deviation        Wrist pronation        Wrist supination         (Blank rows = not tested)  PALPATION:  Incisional sites appear to be healing very well.  Minimal palpable left shoulder tenderness.  CC is pain into her middle deltoid and occasions to elbow which appear to be referred in nature.   TODAY'S TREATMENT:                                                                                                                                         DATE:   12/28/22:  UBE x 10 minutes at 90 RPM's f/b low load long duration stretching technique x 20 minutes.    12/21/22:                                       EXERCISE LOG  Exercise Repetitions and Resistance  Comments  UBE 90 RPM's x 10 minutes   Pulleys 5 minutes   UE Ranger 2# x 5 minutes   Left shoulder ER at 90 abduction 3 sets to fatigue   Bicep curls 5# to fatigue x 2   flex, abd and full can 2# to fatigue x 2    Overhead punches 3# to fatigue x 2.   Prone rows 3# to fatigue x 2.   Bent shoulder ext 3# to fatigue x 2.   Vasopneumatic on low x 20 minutes to patient's left shoulder.  12/14/22:                                     EXERCISE LOG  Exercise Repetitions and Resistance  Comments  UBE @120  RPM's x 10 minutes.   Pulleys 5 minutes   XTS Seated lat pulldown with green band to fatigue x 2.   XTS Shoulder extension to fatigue x 2    XTS  Punches with green band to fatigue x 2   Bicep curls 3# to fatigue x 2   Supine triceps 3# to fatigue x 2   Towel stretch (behind back) 4 reps 30 sec holds.   Vasopneumatic on low x 20 minutes to patient's left shoulder.  12/07/22:                                     EXERCISE LOG  Exercise Repetitions and Resistance Comments  UBE 10 minutes       Pulleys 5 minutes   Wall ladder 5 minutes   Abd 1# to fatigue   Full can 1# to fatigue   flex 1# to fatigue   ER at 45 degrees of abd 1# to fatigue   Vasopneumatic on low x 20 minutes.   12/02/22:                                    EXERCISE LOG  Exercise Repetitions and Resistance Comments  UBE 10 minutes at 120 RPM's (5 mins for and 5 mins back)   Pulleys 5 minutes   RW4 Yellow band to fatigue.   SDLY(Right) ER 1# to fatigue x 2.       PROM x 10 minutes f/b vasopneumatic on low x 20 minutes.                         PATIENT EDUCATION: Education details: See below. Person educated: Patient Education method: Explanation, Demonstration, Tactile cues, Verbal cues, and Handouts Education comprehension: verbalized understanding, returned demonstration, verbal cues required, and tactile cues required  HOME EXERCISE PROGRAM:   HOME EXERCISE PROGRAM Created by [place] Delona Clasby Oct  22nd, 2024 View at [email or web address] using code: [email or web address]  Page 1 of 1 2 Exercises PRONE ROW OAZ While standing, bend over and support yourself with your unaffected arm by placing your hand on the table. Start with your affected arm hanging straight down. Then raise your elbow back and up, allowing your arm to bend. Focus on squeezing your shoulder blade towards your spine on the way up. Repeat 15 Times Hold 2 Seconds Complete 2 Sets Perform 2 Times a Day Bent over shoulder extension With your left leg forward with the green band. Extend your shoulder backwards, squeezing your left shoulder blade to engage/start the movement. Perform 15 repetitions. 2 sets. 3x/week Repeat 15 Times Hold 1 Second Complete 2 Sets Perform 2 Times a Day   ASSESSMENT:  CLINICAL IMPRESSION:  Patient has been highly motivated and is compliant to her HEP.  Treatment focused on stretching today to maximize range of motion. Excellent progression toward goals.  2 visits remaining.  OBJECTIVE IMPAIRMENTS: decreased activity tolerance, decreased ROM, and pain.   ACTIVITY LIMITATIONS: carrying, lifting, and reach over head  PARTICIPATION LIMITATIONS: meal prep, cleaning, and laundry  PERSONAL FACTORS: Time since onset of injury/illness/exacerbation are also affecting patient's functional outcome.   REHAB POTENTIAL: Good  CLINICAL DECISION MAKING: Stable/uncomplicated  EVALUATION COMPLEXITY: Low   GOALS:  SHORT TERM GOALS: Target date: 11/09/22  Ind with an initial HEP. Goal  status: INITIAL   LONG TERM GOALS: Target date: 12/07/22  Ind with an advanced HEP.  Goal status: Ongoing.  2.  Active left shoulder flexion to 145 degrees so the patient can easily reach overhead.  Goal status: MET.  3.  Active ER to 70 degrees+ to allow for easily donning/doffing of apparel.  Goal status: MET.  4.  Increase ROM so patient is able to reach behind back to L3.  Goal status: Ongoing.  5.  Increase  left shoulder strength to a solid 4+/5 to increase stability for performance of functional activities.  Goal status: Ongoing.  6.  Perform ADL's with pain not > 3/10.  Goal status: Partially met.  PLAN:  PT FREQUENCY: 2x/week  PT DURATION: 6 weeks  PLANNED INTERVENTIONS: Therapeutic exercises, Therapeutic activity, Neuromuscular re-education, Patient/Family education, Self Care, Electrical stimulation, Cryotherapy, Moist heat, Vasopneumatic device, Ultrasound, and Manual therapy  PLAN FOR NEXT SESSION: PRE.     Tyree Fluharty, [place], PT 12/28/2022, 4:29 PM

## 2023-01-04 ENCOUNTER — Ambulatory Visit: Payer: 59 | Attending: Orthopaedic Surgery | Admitting: Physical Therapy

## 2023-01-04 DIAGNOSIS — M25612 Stiffness of left shoulder, not elsewhere classified: Secondary | ICD-10-CM | POA: Diagnosis present

## 2023-01-04 DIAGNOSIS — M25512 Pain in left shoulder: Secondary | ICD-10-CM | POA: Diagnosis present

## 2023-01-04 DIAGNOSIS — G8929 Other chronic pain: Secondary | ICD-10-CM | POA: Diagnosis present

## 2023-01-04 NOTE — Therapy (Signed)
OUTPATIENT PHYSICAL THERAPY SHOULDER TREATMENT Patient Name: Karina Aguirre MRN: [id number] DOB:Sep 25, 1960, 62 y.o., female Today's Date: 01/04/2023  END OF SESSION:  PT End of Session - 01/04/23 1603     Visit Number 17    Number of Visits 18    Date for PT Re-Evaluation 12/28/22    Authorization Type FOTO    PT Start Time 0400    PT Stop Time 0436    PT Time Calculation (min) 36 min    Activity Tolerance Patient tolerated treatment well    Behavior During Therapy WFL for tasks assessed/performed                Past Medical History:  Diagnosis Date   GERD (gastroesophageal reflux disease)    Seizure (HCC)    Past Surgical History:  Procedure Laterality Date   BLADDER DIVERTICULECTOMY     BREAST SURGERY     x2   ELBOW SURGERY     HERNIA REPAIR     NOSE SURGERY     x2   PANCREATECTOMY     Patient Active Problem List   Diagnosis Date Noted   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  THERAPY DIAG:  Chronic left shoulder pain  Stiffness of left shoulder, not elsewhere classified  Rationale for Evaluation and Treatment: Rehabilitation  ONSET DATE: 09/06/22 (surgery date).  SUBJECTIVE:                                                                                                                                                                                      SUBJECTIVE STATEMENT: Sore but doing good.  PERTINENT HISTORY: See  above.  PAIN:  Are you having pain? Low/10.  PRECAUTIONS: Other: Begin with PROM, PAROM to patient's left shoulder.   AAROM.   WEIGHT BEARING RESTRICTIONS:  No left UE weight bearing.  FALLS:  Has patient fallen in last 6 months? 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  NEXT MD VISIT:   OBJECTIVE:   PATIENT SURVEYS:  FOTO .   UPPER EXTREMITY ROM:   In supine:  Gentle left shoulder PROM into flexion is 75 degrees, ER is -20 degrees from neutral and abduction 40 degrees.  UPPER EXTREMITY ROM:  Passive ROM Right eval Left Eval(10/26/22) 11/04/22 11/11/22 11/18/22 12/07/22  Shoulder flexion  75 degrees 125 degrees 135 in supine 150 in supine Standing 150 degrees  Shoulder extension        Shoulder abduction        Shoulder adduction        Shoulder extension        Shoulder internal rotation        Shoulder external rotation  -20 degrees from neutral 35 degrees 48 degrees 55 degrees. Standing with back against wall 70 degrees  Elbow flexion        Elbow extension        Wrist flexion        Wrist extension        Wrist ulnar deviation        Wrist radial deviation        Wrist pronation        Wrist supination         (Blank rows = not tested)  PALPATION:  Incisional sites appear to be healing very well.  Minimal palpable left shoulder tenderness.  CC is pain into her middle deltoid and occasions to elbow which appear to be referred in nature.   TODAY'S TREATMENT:                                                                                                                                         DATE:   01/04/23:                                     EXERCISE LOG  Exercise Repetitions and Resistance Comments  UBE  90 RPM's x 10 minutes   Pulleys 5 minutes   Corner stretch 2 minutes    Towel stretch 2 minutes    Bicep curls 5# to fatigue x 2.   Left shoulder abd 2# to fatigue x 2   Full can 2# to fatigue x 2   Left shoulder flex 2# to fatigue x 2.   Overhead punches 2# to fatigue x 2.   [facility] ER 2# to fatigue x 2.     12/28/22:  UBE x 10 minutes at 90 RPM's f/b low load long duration stretching technique x 20 minutes.    12/21/22:                                       EXERCISE LOG  Exercise Repetitions and Resistance Comments  UBE 90 RPM's x 10 minutes  Pulleys 5 minutes   UE Ranger 2# x 5 minutes   Left shoulder ER at 90 abduction 3 sets to fatigue   Bicep curls 5# to fatigue x 2   flex, abd and full can 2# to fatigue x 2    Overhead punches 3# to fatigue x 2.   Prone rows 3# to fatigue x 2.   Bent shoulder ext 3# to fatigue x 2.   Vasopneumatic on low x 20 minutes to patient's left shoulder.  12/14/22:                                     EXERCISE LOG  Exercise Repetitions and Resistance Comments  UBE @120  RPM's x 10 minutes.   Pulleys 5 minutes   XTS Seated lat pulldown with green band to fatigue x 2.   XTS Shoulder extension to fatigue x 2    XTS  Punches with green band to fatigue x 2   Bicep curls 3# to fatigue x 2   Supine triceps 3# to fatigue x 2   Towel stretch (behind back) 4 reps 30 sec holds.   Vasopneumatic on low x 20 minutes to patient's left shoulder.  12/07/22:                                     EXERCISE LOG  Exercise Repetitions and Resistance Comments  UBE 10 minutes       Pulleys 5 minutes   Wall ladder 5 minutes   Abd 1# to fatigue   Full can 1# to fatigue   flex 1# to fatigue   ER at 45 degrees of abd 1# to fatigue   Vasopneumatic on low x 20 minutes.   12/02/22:                                    EXERCISE LOG  Exercise Repetitions and Resistance Comments  UBE 10 minutes at 120 RPM's (5 mins for and 5 mins back)   Pulleys 5 minutes   RW4 Yellow band to fatigue.   SDLY(Right) ER 1# to fatigue x 2.       PROM x 10 minutes f/b  vasopneumatic on low x 20 minutes.                         PATIENT EDUCATION: Education details: See below. Person educated: Patient Education method: Explanation, Demonstration, Tactile cues, Verbal cues, and Handouts Education comprehension: verbalized understanding, returned demonstration, verbal cues required, and tactile cues required  HOME EXERCISE PROGRAM:   HOME EXERCISE PROGRAM Created by [place] Zamyia Gowell Oct 22nd, 2024 View at [email or web address] using code: [email or web address]  Page 1 of 1 2 Exercises PRONE ROW OAZ While standing, bend over and support yourself with your unaffected arm by placing your hand on the table. Start with your affected arm hanging straight down. Then raise your elbow back and up, allowing your arm to bend. Focus on squeezing your shoulder blade towards your spine on the way up. Repeat 15 Times Hold 2 Seconds Complete 2 Sets Perform 2 Times a Day Bent over shoulder extension With your left leg forward with the green band. Extend your shoulder backwards, squeezing your left shoulder blade to engage/start the movement.  Perform 15 repetitions. 2 sets. 3x/week Repeat 15 Times Hold 1 Second Complete 2 Sets Perform 2 Times a Day   ASSESSMENT:  CLINICAL IMPRESSION:  Great progression with FOTO score improved to 62.  OBJECTIVE IMPAIRMENTS: decreased activity tolerance, decreased ROM, and pain.   ACTIVITY LIMITATIONS: carrying, lifting, and reach over head  PARTICIPATION LIMITATIONS: meal prep, cleaning, and laundry  PERSONAL FACTORS: Time since onset of injury/illness/exacerbation are also affecting patient's functional outcome.   REHAB POTENTIAL: Good  CLINICAL DECISION MAKING: Stable/uncomplicated  EVALUATION COMPLEXITY: Low   GOALS:  SHORT TERM GOALS: Target date: 11/09/22  Ind with an initial HEP. Goal status: INITIAL   LONG TERM GOALS: Target date: 12/07/22  Ind with an advanced HEP.  Goal status: Ongoing.  2.  Active left  shoulder flexion to 145 degrees so the patient can easily reach overhead.  Goal status: MET.  3.  Active ER to 70 degrees+ to allow for easily donning/doffing of apparel.  Goal status: MET.  4.  Increase ROM so patient is able to reach behind back to L3.  Goal status: Ongoing.  5.  Increase left shoulder strength to a solid 4+/5 to increase stability for performance of functional activities.  Goal status: Ongoing.  6.  Perform ADL's with pain not > 3/10.  Goal status: Partially met.  PLAN:  PT FREQUENCY: 2x/week  PT DURATION: 6 weeks  PLANNED INTERVENTIONS: Therapeutic exercises, Therapeutic activity, Neuromuscular re-education, Patient/Family education, Self Care, Electrical stimulation, Cryotherapy, Moist heat, Vasopneumatic device, Ultrasound, and Manual therapy  PLAN FOR NEXT SESSION: PRE.     Osualdo Hansell, [place], PT 01/04/2023, 4:45 PM

## 2023-01-11 ENCOUNTER — Encounter: Payer: Self-pay | Admitting: Physical Therapy

## 2023-01-11 ENCOUNTER — Ambulatory Visit: Payer: 59 | Admitting: Physical Therapy

## 2023-01-11 DIAGNOSIS — M25612 Stiffness of left shoulder, not elsewhere classified: Secondary | ICD-10-CM

## 2023-01-11 DIAGNOSIS — M25512 Pain in left shoulder: Secondary | ICD-10-CM | POA: Diagnosis not present

## 2023-01-11 DIAGNOSIS — G8929 Other chronic pain: Secondary | ICD-10-CM

## 2023-01-11 NOTE — Therapy (Addendum)
OUTPATIENT PHYSICAL THERAPY SHOULDER TREATMENT Patient Name: Karina Aguirre MRN: [id number] DOB:September 10, 1960, 62 y.o., female Today's Date: 01/11/2023  END OF SESSION:  PT End of Session - 01/11/23 1649     Visit Number 18    Number of Visits 18    Date for PT Re-Evaluation 01/11/23    PT Start Time 0400    PT Stop Time 0441    PT Time Calculation (min) 41 min    Behavior During Therapy WFL for tasks assessed/performed                 Past Medical History:  Diagnosis Date   GERD (gastroesophageal reflux disease)    Seizure (HCC)    Past Surgical History:  Procedure Laterality Date   BLADDER DIVERTICULECTOMY     BREAST SURGERY     x2   ELBOW SURGERY     HERNIA REPAIR     NOSE SURGERY     x2   PANCREATECTOMY     Patient Active Problem List   Diagnosis Date Noted   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  THERAPY DIAG:  Chronic left shoulder pain - Plan: PT plan of care cert/re-cert  Stiffness of left shoulder, not elsewhere classified - Plan: PT plan of care cert/re-cert  Rationale for Evaluation and Treatment: Rehabilitation  ONSET DATE: 09/06/22 (surgery date).  SUBJECTIVE:                                                                                                                                                                                      SUBJECTIVE STATEMENT: Sore from work activities.  PERTINENT HISTORY: See  above.  PAIN:  Are you having pain? Sore./10.  PRECAUTIONS: Other: Begin with PROM, PAROM to patient's left shoulder.   AAROM.   WEIGHT BEARING RESTRICTIONS:  No left UE weight bearing.  FALLS:  Has patient fallen in last 6 months? 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  NEXT MD VISIT:   OBJECTIVE:   PATIENT SURVEYS:  FOTO .   UPPER EXTREMITY ROM:   In supine:  Gentle left shoulder PROM into flexion is 75 degrees, ER is -20 degrees from neutral and abduction 40 degrees.  UPPER EXTREMITY ROM:  Passive ROM Right eval Left Eval(10/26/22) 11/04/22 11/11/22 11/18/22 12/07/22  Shoulder flexion  75 degrees 125 degrees 135 in supine 150 in supine Standing 150 degrees  Shoulder extension        Shoulder abduction        Shoulder adduction        Shoulder extension        Shoulder internal rotation        Shoulder external rotation  -20 degrees from neutral 35 degrees 48 degrees 55 degrees. Standing with back against wall 70 degrees  Elbow flexion        Elbow extension        Wrist flexion        Wrist extension        Wrist ulnar deviation        Wrist radial deviation        Wrist pronation        Wrist supination         (Blank rows = not tested)  PALPATION:  Incisional sites appear to be healing very well.  Minimal palpable left shoulder tenderness.  CC is pain into her middle deltoid and occasions to elbow which appear to be referred in nature.   TODAY'S TREATMENT:                                                                                                                                         DATE:   01/11/23:                                       EXERCISE LOG  Exercise Repetitions and Resistance Comments  UBE 10 minutes   Pulleys 5 minutes   UE Ranger  5 minutes   RW4 To fatigue..yellow for ER and red  other three motions all to fatigue   2# flexion To fatigue.   2# abd To fatigue   2# full can To fatigue.   2# sdly ER To Fatigue.     01/04/23:                                     EXERCISE LOG  Exercise Repetitions and Resistance Comments  UBE  90 RPM's x 10 minutes   Pulleys 5 minutes   Corner stretch 2 minutes    Towel stretch 2 minutes   Bicep curls 5# to fatigue x 2.   Left shoulder abd 2# to fatigue x 2   Full can 2# to fatigue x 2  Left shoulder flex 2# to fatigue x 2.   Overhead punches 2# to fatigue x 2.   [facility] ER 2# to fatigue x 2.     12/28/22:  UBE x 10 minutes at 90 RPM's f/b low load long duration stretching technique x 20 minutes.    12/21/22:                                       EXERCISE LOG  Exercise Repetitions and Resistance Comments  UBE 90 RPM's x 10 minutes   Pulleys 5 minutes   UE Ranger 2# x 5 minutes   Left shoulder ER at 90 abduction 3 sets to fatigue   Bicep curls 5# to fatigue x 2   flex, abd and full can 2# to fatigue x 2    Overhead punches 3# to fatigue x 2.   Prone rows 3# to fatigue x 2.   Bent shoulder ext 3# to fatigue x 2.   Vasopneumatic on low x 20 minutes to patient's left shoulder.  12/14/22:                                     EXERCISE LOG  Exercise Repetitions and Resistance Comments  UBE @120  RPM's x 10 minutes.   Pulleys 5 minutes   XTS Seated lat pulldown with green band to fatigue x 2.   XTS Shoulder extension to fatigue x 2    XTS  Punches with green band to fatigue x 2   Bicep curls 3# to fatigue x 2   Supine triceps 3# to fatigue x 2   Towel stretch (behind back) 4 reps 30 sec holds.   Vasopneumatic on low x 20 minutes to patient's left shoulder.  12/07/22:                                     EXERCISE LOG  Exercise Repetitions and Resistance Comments  UBE 10 minutes       Pulleys 5 minutes   Wall ladder 5 minutes   Abd 1# to fatigue   Full can 1# to fatigue   flex 1# to fatigue   ER at 45  degrees of abd 1# to fatigue   Vasopneumatic on low x 20 minutes.   12/02/22:                                    EXERCISE LOG  Exercise Repetitions and Resistance Comments  UBE 10 minutes at 120 RPM's (5 mins for and 5 mins back)   Pulleys 5 minutes   RW4 Yellow band to fatigue.   SDLY(Right) ER 1# to fatigue x 2.       PROM x 10 minutes f/b vasopneumatic on low x 20 minutes.                         PATIENT EDUCATION: Education details: See below. Person educated: Patient Education method: Explanation, Demonstration, Tactile cues, Verbal cues, and Handouts Education comprehension: verbalized understanding, returned demonstration, verbal cues required, and tactile cues required  HOME EXERCISE PROGRAM:   HOME EXERCISE PROGRAM Created by [place] Azalya Galyon Oct 22nd,  2024 View at [email or web address] using code: [email or web address]  Page 1 of 1 2 Exercises PRONE ROW OAZ While standing, bend over and support yourself with your unaffected arm by placing your hand on the table. Start with your affected arm hanging straight down. Then raise your elbow back and up, allowing your arm to bend. Focus on squeezing your shoulder blade towards your spine on the way up. Repeat 15 Times Hold 2 Seconds Complete 2 Sets Perform 2 Times a Day Bent over shoulder extension With your left leg forward with the green band. Extend your shoulder backwards, squeezing your left shoulder blade to engage/start the movement. Perform 15 repetitions. 2 sets. 3x/week Repeat 15 Times Hold 1 Second Complete 2 Sets Perform 2 Times a Day   ASSESSMENT:  CLINICAL IMPRESSION:  See d/c summary.  OBJECTIVE IMPAIRMENTS: decreased activity tolerance, decreased ROM, and pain.   ACTIVITY LIMITATIONS: carrying, lifting, and reach over head  PARTICIPATION LIMITATIONS: meal prep, cleaning, and laundry  PERSONAL FACTORS: Time since onset of injury/illness/exacerbation are also affecting patient's functional outcome.    REHAB POTENTIAL: Good  CLINICAL DECISION MAKING: Stable/uncomplicated  EVALUATION COMPLEXITY: Low   GOALS:  SHORT TERM GOALS: Target date: 11/09/22  Ind with an initial HEP. Goal status: INITIAL   LONG TERM GOALS: Target date: 12/07/22  Ind with an advanced HEP.  Goal status: Ongoing.  2.  Active left shoulder flexion to 145 degrees so the patient can easily reach overhead.  Goal status: MET.  3.  Active ER to 70 degrees+ to allow for easily donning/doffing of apparel.  Goal status: MET.  4.  Increase ROM so patient is able to reach behind back to L3.  Goal status: MET  5.  Increase left shoulder strength to a solid 4+/5 to increase stability for performance of functional activities.  Goal status: MET  6.  Perform ADL's with pain not > 3/10.  Goal status: MET  PLAN:  PT FREQUENCY: 2x/week  PT DURATION: 6 weeks  PLANNED INTERVENTIONS: Therapeutic exercises, Therapeutic activity, Neuromuscular re-education, Patient/Family education, Self Care, Electrical stimulation, Cryotherapy, Moist heat, Vasopneumatic device, Ultrasound, and Manual therapy  PLAN FOR NEXT SESSION: PRE.  PHYSICAL THERAPY DISCHARGE SUMMARY  Visits from Start of Care: 18.  Current functional level related to goals / functional outcomes: See above.   Remaining deficits: All goals met.   Education / Equipment: HEP.   Patient agrees to discharge. Patient goals were met. Patient is being discharged due to meeting the stated rehab goals.    Stacie Templin, [place], PT 01/11/2023, 4:49 PM

## 2023-02-02 ENCOUNTER — Ambulatory Visit: Payer: 59 | Admitting: Orthopaedic Surgery

## 2023-02-02 ENCOUNTER — Encounter: Payer: Self-pay | Admitting: Orthopaedic Surgery

## 2023-02-02 VITALS — Ht 60.0 in | Wt 154.0 lb

## 2023-02-02 DIAGNOSIS — M51369 Other intervertebral disc degeneration, lumbar region without mention of lumbar back pain or lower extremity pain: Secondary | ICD-10-CM | POA: Insufficient documentation

## 2023-02-02 DIAGNOSIS — M5136 Other intervertebral disc degeneration, lumbar region with discogenic back pain only: Secondary | ICD-10-CM

## 2023-02-02 DIAGNOSIS — M7542 Impingement syndrome of left shoulder: Secondary | ICD-10-CM | POA: Diagnosis not present

## 2023-02-02 NOTE — Progress Notes (Signed)
Office Visit Note   Patient: Karina Aguirre           Date of Birth: June 12, 1960           MRN: [id number] Visit Date: 02/02/2023              Requested by: Carylon Perches, MD [address] [place],  [place] [address] PCP: Carylon Perches, MD   Assessment & Plan: Visit Diagnoses:  1. Impingement syndrome of left shoulder   2. Degeneration of intervertebral disc of lumbar region with discogenic back pain     Plan: Patient requested and supplied a work note for full duty as of today no restrictions.  She is very happy with results of surgery and will follow-up on an as-needed basis.  We reviewed MRI images and again discussed the single level disc degeneration with edematous changes causing her back aching and pain which with time should settle down.  Follow-Up Instructions: No follow-ups on file.   Orders:  No orders of the defined types were placed in this encounter.  No orders of the defined types were placed in this encounter.     Procedures: No procedures performed   Clinical Data: No additional findings.   Subjective: Chief Complaint  Patient presents with   Left Shoulder - Follow-up    09/06/2022 left shoulder arthroscopy, debridement, biceps tenodesis, rotator cuff tear repair    HPI 62 year old female returns post left shoulder rotator cuff repair debridement biceps tenodesis and rotator cuff repair.  She can get her arm up overhead she states she is ready to resume regular work restrictions.  She still has stiffness in her back she states she has great difficulty when she first gets up if she walks a little bit her back loosens up she feels better.  If she sits for a while she has significant stiffness.  MRI showed L2-3 disc degeneration Modic changes with disc space narrowing and facet arthropathy.  Review of Systems updated unchanged   Objective: Vital Signs: Ht 5' (1.524 m)   Wt 154 lb (69.9 kg)   BMI 30.08 kg/m   Physical Exam Constitutional:       Appearance: She is well-developed.  HENT:     Head: Normocephalic.     Right Ear: External ear normal.     Left Ear: External ear normal. There is no impacted cerumen.  Eyes:     Pupils: Pupils are equal, round, and reactive to light.  Neck:     Thyroid: No thyromegaly.     Trachea: No tracheal deviation.  Cardiovascular:     Rate and Rhythm: Normal rate.  Pulmonary:     Effort: Pulmonary effort is normal.  Abdominal:     Palpations: Abdomen is soft.  Musculoskeletal:     Cervical back: No rigidity.  Skin:    General: Skin is warm and dry.  Neurological:     Mental Status: She is alert and oriented to person, place, and time.  Psychiatric:        Behavior: Behavior normal.     Ortho Exam patient can reach behind her to T12 reach arm overhead with no pain negative drop arm test negative impingement full range of motion of the left shoulder.  Specialty Comments:  No specialty comments available.  Imaging: No results found.   PMFS History: Patient Active Problem List   Diagnosis Date Noted   Disc degeneration, lumbar 02/02/2023   Partial nontraumatic tear of left rotator cuff 08/04/2022  Tendinitis of upper biceps tendon of left shoulder 08/04/2022   Impingement syndrome of left shoulder 05/26/2022   Trochanteric bursitis, left hip 12/25/2021   Protrusion of lumbar intervertebral disc 10/01/2021   Carpal tunnel syndrome of right wrist 06/21/2016   Pain in left ankle and joints of left foot 01/15/2016   CHEST PAIN 11/07/2009   Past Medical History:  Diagnosis Date   GERD (gastroesophageal reflux disease)    Seizure (HCC)     Family History  Problem Relation Age of Onset   Hypertension Father     Past Surgical History:  Procedure Laterality Date   BLADDER DIVERTICULECTOMY     BREAST SURGERY     x2   ELBOW SURGERY     HERNIA REPAIR     NOSE SURGERY     x2   PANCREATECTOMY     Social History   Occupational History   Not on file  Tobacco Use    Smoking status: Never   Smokeless tobacco: Never  Substance and Sexual Activity   Alcohol use: No   Drug use: No   Sexual activity: Not on file

## 2023-05-24 ENCOUNTER — Encounter: Payer: Self-pay | Admitting: Nurse Practitioner

## 2023-07-01 ENCOUNTER — Encounter: Payer: Self-pay | Admitting: Radiology

## 2023-07-15 ENCOUNTER — Other Ambulatory Visit

## 2023-07-15 ENCOUNTER — Ambulatory Visit: Admitting: Nurse Practitioner

## 2023-07-15 ENCOUNTER — Encounter: Payer: Self-pay | Admitting: Nurse Practitioner

## 2023-07-15 VITALS — BP 120/70 | HR 72 | Ht 61.5 in | Wt 161.2 lb

## 2023-07-15 DIAGNOSIS — R1084 Generalized abdominal pain: Secondary | ICD-10-CM | POA: Diagnosis not present

## 2023-07-15 DIAGNOSIS — K760 Fatty (change of) liver, not elsewhere classified: Secondary | ICD-10-CM | POA: Diagnosis not present

## 2023-07-15 DIAGNOSIS — K824 Cholesterolosis of gallbladder: Secondary | ICD-10-CM | POA: Diagnosis not present

## 2023-07-15 DIAGNOSIS — R112 Nausea with vomiting, unspecified: Secondary | ICD-10-CM | POA: Insufficient documentation

## 2023-07-15 DIAGNOSIS — R1013 Epigastric pain: Secondary | ICD-10-CM | POA: Diagnosis not present

## 2023-07-15 DIAGNOSIS — Z8719 Personal history of other diseases of the digestive system: Secondary | ICD-10-CM

## 2023-07-15 LAB — COMPREHENSIVE METABOLIC PANEL WITH GFR
ALT: 20 U/L (ref 0–35)
AST: 20 U/L (ref 0–37)
Albumin: 4.2 g/dL (ref 3.5–5.2)
Alkaline Phosphatase: 92 U/L (ref 39–117)
BUN: 17 mg/dL (ref 6–23)
CO2: 29 meq/L (ref 19–32)
Calcium: 8.9 mg/dL (ref 8.4–10.5)
Chloride: 106 meq/L (ref 96–112)
Creatinine, Ser: 0.79 mg/dL (ref 0.40–1.20)
GFR: 79.46 mL/min (ref >60.00)
Glucose, Bld: 107 mg/dL — ABNORMAL HIGH (ref 70–99)
Potassium: 5 meq/L (ref 3.5–5.1)
Sodium: 141 meq/L (ref 135–145)
Total Bilirubin: 0.4 mg/dL (ref 0.2–1.2)
Total Protein: 7.2 g/dL (ref 6.0–8.3)

## 2023-07-15 LAB — CBC WITH DIFFERENTIAL/PLATELET
Basophils Absolute: 0 10*3/uL (ref 0.0–0.1)
Basophils Relative: 0.2 % (ref 0.0–3.0)
Eosinophils Absolute: 0.2 10*3/uL (ref 0.0–0.7)
Eosinophils Relative: 2.3 % (ref 0.0–5.0)
HCT: 41.4 % (ref 36.0–46.0)
Hemoglobin: 13.5 g/dL (ref 12.0–15.0)
Lymphocytes Relative: 33.4 % (ref 12.0–46.0)
Lymphs Abs: 2.3 10*3/uL (ref 0.7–4.0)
MCHC: 32.6 g/dL (ref 30.0–36.0)
MCV: 89.5 fl (ref 78.0–100.0)
Monocytes Absolute: 0.8 10*3/uL (ref 0.1–1.0)
Monocytes Relative: 11.2 % (ref 3.0–12.0)
Neutro Abs: 3.6 10*3/uL (ref 1.4–7.7)
Neutrophils Relative %: 52.9 % (ref 43.0–77.0)
Platelets: 183 10*3/uL (ref 150.0–400.0)
RBC: 4.63 Mil/uL (ref 3.87–5.11)
RDW: 13.2 % (ref 11.5–15.5)
WBC: 6.7 10*3/uL (ref 4.0–10.5)

## 2023-07-15 NOTE — Progress Notes (Addendum)
 07/15/2023 VANASSA PENNIMAN [id number] [birth year]   CHIEF COMPLAINT: Episodes of nausea, vomiting and abdominal pain  HISTORY OF PRESENT ILLNESS: Delayni Streed. Menard is a 63 year old female with a past medical history of seizures (last seizure occurred 15 years ago), pancreatitis age 60 (etiology unknown), GERD and diverticulitis 10+ years ago. She was previously followed by [facility] and she wishes to transition her GI management with Dr. Bridgett Camps. She presents to our office today as referred by Dr. Artemisa Bile for further evaluation regarding episodic nausea, vomiting and epigastric pain which initially started 2 years ago. She describes having a total of 5 episodes since none. She describes having infrequent random episodes of nausea, vomiting and central upper abdominal pain. Emesis described as yellow or green bilious fluid, no coffee-ground or frank hematemesis. Her last episode of nausea/vomiting and central upper abdominal pain occurred approximately 2 months ago, symptoms improve after she vomits. No specific food triggers. She underwent RUQ sonogram 06/17/2021 which identified a probable 7 mm gallbladder polyp and hepatic steatosis. She has a history of GERD for which she takes Omeprazole  40 mg once daily for several years. No heartburn or dysphagia.  She denies ever having an EGD. Her bowel movements are normal, no bloody or black stools.  She endorses undergoing a colonoscopy at the age of 32 and in 2017 which she reported were normal, no polyps.  Cologuard test 06/23/2020 was negative.  No known family history of esophageal, gastric or colorectal cancer.  Labs 06/23/2022: WBC 7.3.  Hemoglobin 14.  Platelet 208.  RUQ sonogram 06/17/2021: FINDINGS: Gallbladder:   Within the gallbladder lumen there is a 7 mm echogenic mass, potentially representing a small polyp. No gallbladder wall thickening or pericholecystic fluid. Negative sonographic Murphy's sign.   Common bile duct:    Diameter: 2 mm   Liver:   Increased echogenicity. No focal lesion identified. Portal vein is patent on color Doppler imaging with normal direction of blood flow towards the liver.   Other: None.   IMPRESSION: Probable 7 mm polyp within the gallbladder lumen. Recommend follow-up ultrasound right upper quadrant in 1 year to reassess.   No acute process.   Hepatic steatosis.   Past Medical History:  Diagnosis Date   Diverticulitis    GERD (gastroesophageal reflux disease)    Pancreatitis    Seizure (HCC)    Past Surgical History:  Procedure Laterality Date   BREAST SURGERY Left    x2   ELBOW SURGERY Left    NASAL SINUS SURGERY     x2   ROTATOR CUFF REPAIR Left    UMBILICAL HERNIA REPAIR     Social History: She is single.  She is a Merchandiser, retail.  Non-smoker.  No alcohol use.  No drug use.  Family History: family history includes Asthma in her maternal grandmother; Bone cancer in her paternal grandmother; Breast cancer in her mother; Diabetes in her father; Heart disease in her mother; Heart murmur in her mother; Hyperlipidemia in her brother and sister; Hypertension in her brother, father, and sister.  No known family history of esophageal, gastric or colon cancer.   Allergies  Allergen Reactions   Vitamin E Rash    Other reaction(s): rash, itches   Aloe Aftersun [Dimethicone]    Bacitracin    Neosporin [Bacitracin-Polymyxin B] Rash      Outpatient Encounter Medications as of 07/15/2023  Medication Sig   Calcium Carb-Cholecalciferol (CALCIUM 600/VITAMIN D PO) Take 1 capsule by mouth daily.  Cholecalciferol (VITAMIN D PO) Take 2,000 mg by mouth daily.   levocetirizine (XYZAL) 5 MG tablet Take 5 mg by mouth every evening.   MAGNESIUM PO Take 1 capsule by mouth daily.   omeprazole  (PRILOSEC) 40 MG capsule    [DISCONTINUED] HYDROcodone -acetaminophen  (NORCO/VICODIN) 5-325 MG tablet Take 1-2 tablets by mouth every 6 (six) hours as needed for moderate pain.    [DISCONTINUED] ondansetron  (ZOFRAN ) 4 MG tablet Take 1 tablet (4 mg total) by mouth every 8 (eight) hours as needed for nausea or vomiting.   No facility-administered encounter medications on file as of 07/15/2023.   REVIEW OF SYSTEMS:  Gen: Denies fever, sweats or chills. No weight loss.  CV: Denies chest pain, palpitations or edema. Resp: Denies cough, shortness of breath of hemoptysis.  GI: See HPI. GU: + Blood in urine. MS: + Back pain. Derm: Denies rash, itchiness, skin lesions or unhealing ulcers. Psych: Denies depression, anxiety, memory loss or confusion. Heme: Denies bruising, easy bleeding. Neuro:  Denies headaches, dizziness or paresthesias. Endo:  Denies any problems with DM, thyroid or adrenal function.  PHYSICAL EXAM: BP 120/70 (BP Location: Left Arm, Patient Position: Sitting, Cuff Size: Normal)   Pulse 72   Ht 5' 1.5" (1.562 m) Comment: height measured without shoes  Wt 161 lb 4 oz (73.1 kg)   BMI 29.97 kg/m  General: 64 year old female in no acute distress. Head: Normocephalic and atraumatic. Eyes:  Sclerae non-icteric, conjunctive pink. Ears: Normal auditory acuity. Mouth: Dentition intact. No ulcers or lesions.  Neck: Supple, no lymphadenopathy or thyromegaly.  Lungs: Clear bilaterally to auscultation without wheezes, crackles or rhonchi. Heart: Regular rate and rhythm. No murmur, rub or gallop appreciated.  Abdomen: Soft, nontender, nondistended. No masses. No hepatosplenomegaly. Normoactive bowel sounds x 4 quadrants.  Rectal: Deferred. Musculoskeletal: Symmetrical with no gross deformities. Skin: Warm and dry. No rash or lesions on visible extremities. Extremities: No edema. Neurological: Alert oriented x 4, no focal deficits.  Psychological: Alert and cooperative. Normal mood and affect.  ASSESSMENT AND PLAN:  63 year old female with intermittent episodic nausea/vomiting and epigastric pain, last episode occurred 2 months ago.  RUQ sonogram 05/2021  showed a 7 mm gallbladder polyp otherwise was unremarkable. - CBC, CMP - RUQ sonogram - Consider referral to general surgery to discuss possible future cholecystectomy if RUQ sonogram shows evidence of gallstones or enlarging gallbladder polyp - Eventual EGD, await the above lab and RUQ sonogram results -Patient instructed to contact our office at the time of next episode of N/V and epigastric pain, will order CBC, CMET, lipase and CTAP  - Patient instructed to go to the emergency room if she develops severe abdominal pain or excessive nausea/vomiting  History of GERD, no heartburn or dysphagia on Omeprazole  40 mg daily.  Never had an EGD. - See plan above  Hepatic steatosis - Patient instructed to reduce carbohydrates in diet i.e. bread/pasta/potatoes/rice and sweets, exercises tolerated and lose weight to reduce the risk of developing fatty liver disease - Check LFTs as ordered above  Colon cancer screening.  Patient endorses undergoing 2 colonoscopies in her lifetime which were normal, the most recent colonoscopy was in 2017 performed by [facility].  Cologuard test 05/2020. - Request copy of 2017 colonoscopy report from [facility] - To verify colonoscopy recall date after records reviewed  ADDENDUM: Colonoscopy 05/28/2015 by Dr. Delilah Fend records received: Scattered diverticula were found in the sigmoid colon and ascending colon Internal hemorrhoids were found during retroflexion.  The hemorrhoids were grade 2. The  exam was otherwise without abnormality.  The quality of the bowel preparation was good.  EGD records were also received.   She underwent an EGD by Dr. Delilah Fend on 04/26/2012 which showed a small hiatal hernia Patchy mild inflammation characterized by erosions and erythema found in the gastric antrum The examined duodenum was normal. 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 pylori, goblet cell metaplasia, dysplasia or  malignancy.   CC:  Artemisa Bile, MD

## 2023-07-15 NOTE — Patient Instructions (Signed)
 Your provider has requested that you go to the basement level for lab work before leaving today. Press "B" on the elevator. The lab is located at the first door on the left as you exit the elevator.  You have been scheduled for an abdominal ultrasound at [facility] (1st floor of hospital) on 07/26/23 at 8:30 am . Please arrive 30 minutes prior to your appointment for registration. Make certain not to have anything to eat or drink 6 hours prior to your appointment. Should you need to reschedule your appointment, please contact radiology at [phone]. This test typically takes about 30 minutes to perform.  Contact office with worsening symptoms of Nausea and Vomiting.   Due to recent changes in healthcare laws, you may see the results of your imaging and laboratory studies on MyChart before your provider has had a chance to review them.  We understand that in some cases there may be results that are confusing or concerning to you. Not all laboratory results come back in the same time frame and the provider may be waiting for multiple results in order to interpret others.  Please give us  48 hours in order for your provider to thoroughly review all the results before contacting the office for clarification of your results.   _______________________________________________________  If your blood pressure at your visit was 140/90 or greater, please contact your primary care physician to follow up on this.  _______________________________________________________  If you are age 29 or older, your body mass index should be between 23-30. Your Body mass index is 29.97 kg/m. If this is out of the aforementioned range listed, please consider follow up with your Primary Care Provider.  If you are age 73 or younger, your body mass index should be between 19-25. Your Body mass index is 29.97 kg/m. If this is out of the aformentioned range listed, please consider follow up with your Primary Care  Provider.   ________________________________________________________  The [facility] providers would like to encourage you to use MYCHART to communicate with providers for non-urgent requests or questions.  Due to long hold times on the telephone, sending your provider a message by [facility] may be a faster and more efficient way to get a response.  Please allow 48 business hours for a response.  Please remember that this is for non-urgent requests.  _______________________________________________________  Thank you for choosing me and [facility].

## 2023-07-17 ENCOUNTER — Ambulatory Visit: Payer: Self-pay | Admitting: Nurse Practitioner

## 2023-07-18 NOTE — Progress Notes (Signed)
 Addendum: Reviewed and agree with assessment and management plan. Asha Grumbine, Carie Caddy, MD

## 2023-07-26 ENCOUNTER — Ambulatory Visit (HOSPITAL_COMMUNITY)
Admission: RE | Admit: 2023-07-26 | Discharge: 2023-07-26 | Disposition: A | Discharge status: Home / Self Care | Source: Ambulatory Visit | Attending: Nurse Practitioner | Admitting: Nurse Practitioner

## 2023-07-26 DIAGNOSIS — R112 Nausea with vomiting, unspecified: Secondary | ICD-10-CM | POA: Insufficient documentation

## 2023-07-26 DIAGNOSIS — R1084 Generalized abdominal pain: Secondary | ICD-10-CM | POA: Insufficient documentation

## 2023-07-26 NOTE — Progress Notes (Signed)
 Method for colon cancer screening up to patient I am okay with either Cologuard April 2025 thus now or full colonoscopy in March 2027

## 2023-07-26 NOTE — Progress Notes (Signed)
 Linda/Dottie, pls contact patient and let her know regarding colon cancer screening options, she is due for a Cologuard test now as she had a negative Cologuard 05/2020 or if she prefers to schedule a conventional screening colonoscopy 04/2025 (colonoscopy 04/2015, no polyps).  If she opts for the Cologuard test, pls provide order. If she prefers a colonoscopy, pls enter recall colonoscopy due 04/2025. THX.

## 2023-07-27 NOTE — Progress Notes (Signed)
 Spoke with pt and she would prefer to do the colon in 2027. Recall entered in epic.

## 2023-08-11 ENCOUNTER — Ambulatory Visit: Payer: Self-pay | Admitting: Nurse Practitioner

## 2023-08-26 ENCOUNTER — Telehealth: Payer: Self-pay

## 2023-08-26 NOTE — Telephone Encounter (Signed)
 Spoke with the patient regarding the referral to GYN oncology. Patient scheduled as new patient with Dr Viktoria on 09/08/2023. Patient given an arrival time of 8:30am.  Explained to the patient the the doctor will perform a pelvic exam at this visit. Patient given the policy that only one visitor allowed and that visitor must be over 16 yrs are allowed in the Cancer Center. Patient given the address/phone number for the clinic and that the center offers free valet service. Patient aware that masks required.

## 2023-09-07 ENCOUNTER — Encounter: Payer: Self-pay | Admitting: Gynecologic Oncology

## 2023-09-07 NOTE — Progress Notes (Unsigned)
 GYNECOLOGIC ONCOLOGY NEW PATIENT CONSULTATION   Patient Name: Karina Aguirre  Patient Age: 63 y.o. 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  We reviewed the nature of endometrial cancer and its recommended surgical staging, including total hysterectomy, bilateral salpingo-oophorectomy, and lymph node assessment. The patient is a suitable candidate for staging via a minimally invasive approach to surgery.  We reviewed that robotic assistance would be used to complete the surgery.   We discussed that most endometrial cancer is detected early and that decisions regarding adjuvant therapy will be made based on her final pathology.   We reviewed the sentinel lymph node technique. Risks and benefits of sentinel lymph node biopsy was reviewed. We reviewed the technique and ICG dye. The patient DOES NOT have an iodine allergy or known liver dysfunction. We reviewed the false negative rate (0.4%), and that 3% of patients with metastatic disease will not have it detected by SLN biopsy in endometrial cancer. A low risk of allergic reaction to the dye, <0.2% for ICG, has been reported. We also discussed that in the case of failed mapping, which occurs 40% of the time, a bilateral or unilateral lymphadenectomy will be performed at the surgeon's discretion.   Potential benefits of sentinel nodes including a higher detection rate for metastasis due to ultrastaging and potential reduction in operative morbidity. However, there remains uncertainty as to the role for treatment of micrometastatic disease. Further, the benefit of operative morbidity associated with the SLN technique in endometrial cancer is not yet completely known. In other patient populations (e.g. the cervical cancer population) there  has been observed reductions in morbidity with SLN biopsy compared to pelvic lymphadenectomy. Lymphedema, nerve dysfunction and lymphocysts are all potential risks with the SLN technique as with complete lymphadenectomy. Additional risks to the patient include the risk of damage to an internal organ while operating in an altered view (e.g. the black and white image of the robotic fluorescence imaging mode).   Discussed the plan for a robotic assisted hysterectomy, bilateral salpingo-oophorectomy, sentinel lymph node evaluation, possible lymph node dissection, possible laparotomy. The risks of surgery were discussed in detail and she understands these to include infection; wound separation; hernia; vaginal cuff separation, injury to adjacent organs such as bowel, bladder, blood vessels, ureters and nerves; bleeding which may require blood transfusion; anesthesia risk; thromboembolic events; possible death; unforeseen complications; possible need for re-exploration; medical complications such as heart attack, stroke, pleural effusion and pneumonia; and, if full lymphadenectomy is performed the risk of lymphedema and lymphocyst. The patient will receive DVT and antibiotic prophylaxis as indicated. She voiced a clear understanding. She had the opportunity to ask questions. Perioperative instructions were reviewed with her. Prescriptions for post-op medications were sent to her pharmacy of choice.  A copy of this note was sent to the patient's referring provider.   60 minutes of total time was spent for this patient encounter, including preparation, face-to-face counseling with the patient and coordination of care, and documentation of the encounter.  Comer Dollar, MD  Division of Gynecologic Oncology  Department of Obstetrics and Gynecology  [facility]  ___________________________________________  Chief Complaint: Chief Complaint  Patient presents with   endometrial  carcinoma    History of Present Illness:  Karina Aguirre is a 63 y.o. y.o. female who is seen in consultation at  the request of Dr. Lequita for an evaluation of endometrial cancer.  Patient initially presented with postmenopausal bleeding.  Pelvic ultrasound performed on 5/22, small amount of fluid seen within the endometrial cavity and cervical canal.  Uterus measured 4.5 x 3.6 x 1.8 cm with an endometrium of 10.5 mm.  Endometrium thickened with what appeared to be a 25 mm density within the endometrial cavity and 7 mm density within the cervical canal.  No adnexal masses seen.  No free fluid.  On 6/23, the patient underwent hysteroscopy with endometrial sampling.  Findings included endometrium with friable tissue, possibly polypoid at the junction of the lower uterine segment and internal cervical os.  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  Endometrial curettage specimen showed minute fragments of atypical glandular epithelium consistent with adenocarcinoma.  Her surgical history is notable for a hernia repair in the 1980s.  She is unsure whether mesh was placed at this time.  Medical history notable for diverticulosis with an episode of diverticulitis involving the proximal sigmoid colon diagnosed on imaging in 2012.  Today, the patient presents with her boyfriend and sister.  She reports having brown discharge that started in February which she describes as happening most days although not every day.  Discharge was enough to wear a pad.  Denies any bright red bleeding, pelvic pain or cramping.  She endorses a good appetite.  Has lost a few pounds, which she reports was intentional.  Has intermittent baseline constipation, uses Metamucil cookies.  Denies any urinary symptoms.  PAST MEDICAL HISTORY:  Past Medical History:  Diagnosis Date   Diverticulitis    GERD (gastroesophageal reflux disease)    Osteopenia    Pancreatitis     1981   Seizure (HCC)    last seizure in 31s (only two), took medication for long time to prevent     PAST SURGICAL HISTORY:  Past Surgical History:  Procedure Laterality Date   BREAST SURGERY Left    x2   ELBOW SURGERY Left    NASAL SINUS SURGERY  1981   x2   ROTATOR CUFF REPAIR Left    UMBILICAL HERNIA REPAIR  1981   unsure re mesh    OB/GYN HISTORY:  OB History  Gravida Para Term Preterm AB Living  0 0 0 0 0 0  SAB IAB Ectopic Multiple Live Births  0 0 0 0 0    No LMP recorded. Patient is postmenopausal.  Age at menarche: 86  Age at menopause: 46 Hx of HRT: denies Hx of STDs: denies Last pap: 05/2021 - NIML, HR HPV negative History of abnormal pap smears: denies  SCREENING STUDIES:  Last mammogram: 2025  Last colonoscopy: 2017 Last bone mineral density: 2024 - osteopenia  MEDICATIONS: Outpatient Encounter Medications as of 09/08/2023  Medication Sig   Calcium Carb-Cholecalciferol (CALCIUM 600/VITAMIN D PO) Take 1 capsule by mouth daily.   Cholecalciferol (VITAMIN D PO) Take 2,000 mg by mouth daily.   clobetasol cream (TEMOVATE) 0.05 % APPLY CREAM TOPICALLY TO AFFECTED AREA TWICE DAILY   levocetirizine (XYZAL) 5 MG tablet Take 5 mg by mouth every evening.   MAGNESIUM PO Take 1 capsule by mouth daily.   omeprazole  (PRILOSEC) 40 MG capsule    senna-docusate (SENOKOT-S) 8.6-50 MG tablet Take 2 tablets by mouth at bedtime. For AFTER surgery, do not take if having diarrhea   traMADol  (ULTRAM ) 50 MG tablet Take 1 tablet (50 mg total) by mouth every 6 (six) hours as  needed for severe pain (pain score 7-10). For AFTER surgery only, do not take and drive   No facility-administered encounter medications on file as of 09/08/2023.    ALLERGIES:  Allergies  Allergen Reactions   Vitamin E Rash    Other reaction(s): rash, itches   Aloe Aftersun [Dimethicone]    Bacitracin    Neosporin [Bacitracin-Polymyxin B] Rash     FAMILY HISTORY:  Family History  Problem Relation  Age of Onset   Breast cancer Mother 64   Heart murmur Mother    Heart disease Mother    Hypertension Father    Diabetes Father    Hypertension Sister    Hyperlipidemia Sister    Hypertension Brother    Hyperlipidemia Brother    Asthma Maternal Grandmother    Bone cancer Paternal Grandmother    Prostate cancer Neg Hx    Endometrial cancer Neg Hx    Ovarian cancer Neg Hx    Colon cancer Neg Hx    Pancreatic cancer Neg Hx      SOCIAL HISTORY:  Social Connections: Unknown (12/09/2021)   Received from [employer]   Social Network    Social Network: Not on file    REVIEW OF SYSTEMS:  + back pain Denies appetite changes, fevers, chills, fatigue, unexplained weight changes. Denies hearing loss, neck lumps or masses, mouth sores, ringing in ears or voice changes. Denies cough or wheezing.  Denies shortness of breath. Denies chest pain or palpitations. Denies leg swelling. Denies abdominal distention, pain, blood in stools, constipation, diarrhea, nausea, vomiting, or early satiety. Denies pain with intercourse, dysuria, frequency, hematuria or incontinence. Denies hot flashes, pelvic pain, vaginal bleeding or vaginal discharge.   Denies joint pain or muscle pain/cramps. Denies itching, rash, or wounds. Denies dizziness, headaches, numbness or seizures. Denies swollen lymph nodes or glands, denies easy bruising or bleeding. Denies anxiety, depression, confusion, or decreased concentration.  Physical Exam:  Vital Signs for this encounter:  Blood pressure (!) 136/96, pulse 62, temperature 98 F (36.7 C), temperature source Oral, resp. rate 18, height 5' 1 (1.549 m), weight 158 lb 6.4 oz (71.8 kg), SpO2 100%. Body mass index is 29.93 kg/m. General: Alert, oriented, no acute distress.  HEENT: Normocephalic, atraumatic. Sclera anicteric.  Chest: Clear to auscultation bilaterally. No wheezes, rhonchi, or rales. Cardiovascular: Regular rate and rhythm, no murmurs, rubs, or  gallops.  Abdomen: Normoactive bowel sounds. Soft, nondistended, nontender to palpation. No masses or hepatosplenomegaly appreciated. No palpable fluid wave.  Well-healed 4 cm incision below the umbilicus. Extremities: Grossly normal range of motion. Warm, well perfused. No edema bilaterally.  Skin: No rashes or lesions.  Lymphatics: No cervical, supraclavicular, or inguinal adenopathy.  GU:  Normal external female genitalia. No lesions. No discharge or bleeding.             Bladder/urethra:  No lesions or masses, well supported bladder             Vagina: Mild vaginal atrophy, no vaginal lesions.             Cervix: Normal appearing, no lesions. Atrophic.             Uterus: Small, mobile, no parametrial involvement or nodularity.             Adnexa: No masses appreciated.  Rectal: Deferred.  LABORATORY AND RADIOLOGIC DATA:  Outside medical records were reviewed to synthesize the above history, along with the history and physical obtained during the visit.   Lab Results  Component Value Date   WBC 6.7 07/15/2023   HGB 13.5 07/15/2023   HCT 41.4 07/15/2023   PLT 183.0 07/15/2023   GLUCOSE 107 (H) 07/15/2023   CHOL 194 11/18/2009   TRIG 103.0 11/18/2009   HDL 43.40 11/18/2009   LDLCALC 130 (H) 11/18/2009   ALT 20 07/15/2023   AST 20 07/15/2023   NA 141 07/15/2023   K 5.0 07/15/2023   CL 106 07/15/2023   CREATININE 0.79 07/15/2023   BUN 17 07/15/2023   CO2 29 07/15/2023

## 2023-09-07 NOTE — H&P (View-Only) (Signed)
 GYNECOLOGIC ONCOLOGY NEW PATIENT CONSULTATION   Patient Name: Karina Aguirre  Patient Age: 63 y.o. 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  We reviewed the nature of endometrial cancer and its recommended surgical staging, including total hysterectomy, bilateral salpingo-oophorectomy, and lymph node assessment. The patient is a suitable candidate for staging via a minimally invasive approach to surgery.  We reviewed that robotic assistance would be used to complete the surgery.   We discussed that most endometrial cancer is detected early and that decisions regarding adjuvant therapy will be made based on her final pathology.   We reviewed the sentinel lymph node technique. Risks and benefits of sentinel lymph node biopsy was reviewed. We reviewed the technique and ICG dye. The patient DOES NOT have an iodine allergy or known liver dysfunction. We reviewed the false negative rate (0.4%), and that 3% of patients with metastatic disease will not have it detected by SLN biopsy in endometrial cancer. A low risk of allergic reaction to the dye, <0.2% for ICG, has been reported. We also discussed that in the case of failed mapping, which occurs 40% of the time, a bilateral or unilateral lymphadenectomy will be performed at the surgeon's discretion.   Potential benefits of sentinel nodes including a higher detection rate for metastasis due to ultrastaging and potential reduction in operative morbidity. However, there remains uncertainty as to the role for treatment of micrometastatic disease. Further, the benefit of operative morbidity associated with the SLN technique in endometrial cancer is not yet completely known. In other patient populations (e.g. the cervical cancer population) there  has been observed reductions in morbidity with SLN biopsy compared to pelvic lymphadenectomy. Lymphedema, nerve dysfunction and lymphocysts are all potential risks with the SLN technique as with complete lymphadenectomy. Additional risks to the patient include the risk of damage to an internal organ while operating in an altered view (e.g. the black and white image of the robotic fluorescence imaging mode).   Discussed the plan for a robotic assisted hysterectomy, bilateral salpingo-oophorectomy, sentinel lymph node evaluation, possible lymph node dissection, possible laparotomy. The risks of surgery were discussed in detail and she understands these to include infection; wound separation; hernia; vaginal cuff separation, injury to adjacent organs such as bowel, bladder, blood vessels, ureters and nerves; bleeding which may require blood transfusion; anesthesia risk; thromboembolic events; possible death; unforeseen complications; possible need for re-exploration; medical complications such as heart attack, stroke, pleural effusion and pneumonia; and, if full lymphadenectomy is performed the risk of lymphedema and lymphocyst. The patient will receive DVT and antibiotic prophylaxis as indicated. She voiced a clear understanding. She had the opportunity to ask questions. Perioperative instructions were reviewed with her. Prescriptions for post-op medications were sent to her pharmacy of choice.  A copy of this note was sent to the patient's referring provider.   60 minutes of total time was spent for this patient encounter, including preparation, face-to-face counseling with the patient and coordination of care, and documentation of the encounter.  Comer Dollar, MD  Division of Gynecologic Oncology  Department of Obstetrics and Gynecology  [facility]  ___________________________________________  Chief Complaint: Chief Complaint  Patient presents with   endometrial  carcinoma    History of Present Illness:  Karina Aguirre is a 63 y.o. y.o. female who is seen in consultation at  the request of Dr. Lequita for an evaluation of endometrial cancer.  Patient initially presented with postmenopausal bleeding.  Pelvic ultrasound performed on 5/22, small amount of fluid seen within the endometrial cavity and cervical canal.  Uterus measured 4.5 x 3.6 x 1.8 cm with an endometrium of 10.5 mm.  Endometrium thickened with what appeared to be a 25 mm density within the endometrial cavity and 7 mm density within the cervical canal.  No adnexal masses seen.  No free fluid.  On 6/23, the patient underwent hysteroscopy with endometrial sampling.  Findings included endometrium with friable tissue, possibly polypoid at the junction of the lower uterine segment and internal cervical os.  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  Endometrial curettage specimen showed minute fragments of atypical glandular epithelium consistent with adenocarcinoma.  Her surgical history is notable for a hernia repair in the 1980s.  She is unsure whether mesh was placed at this time.  Medical history notable for diverticulosis with an episode of diverticulitis involving the proximal sigmoid colon diagnosed on imaging in 2012.  Today, the patient presents with her boyfriend and sister.  She reports having brown discharge that started in February which she describes as happening most days although not every day.  Discharge was enough to wear a pad.  Denies any bright red bleeding, pelvic pain or cramping.  She endorses a good appetite.  Has lost a few pounds, which she reports was intentional.  Has intermittent baseline constipation, uses Metamucil cookies.  Denies any urinary symptoms.  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 Patient is postmenopausal.  Age at menarche: 45  Age at menopause: 17 Hx of HRT: denies Hx of STDs: denies Last pap: 05/2021 - NIML, HR HPV negative History of abnormal pap smears: denies  SCREENING STUDIES:  Last mammogram: 2025  Last colonoscopy: 2017 Last bone mineral density: 2024 - osteopenia  MEDICATIONS: Outpatient Encounter Medications as of 09/08/2023  Medication Sig   Calcium Carb-Cholecalciferol (CALCIUM 600/VITAMIN D PO) Take 1 capsule by mouth daily.   Cholecalciferol (VITAMIN D PO) Take 2,000 mg by mouth daily.   clobetasol cream (TEMOVATE) 0.05 % APPLY CREAM TOPICALLY TO AFFECTED AREA TWICE DAILY   levocetirizine (XYZAL) 5 MG tablet Take 5 mg by mouth every evening.   MAGNESIUM PO Take 1 capsule by mouth daily.   omeprazole  (PRILOSEC) 40 MG capsule    senna-docusate (SENOKOT-S) 8.6-50 MG tablet Take 2 tablets by mouth at bedtime. For AFTER surgery, do not take if having diarrhea   traMADol  (ULTRAM ) 50 MG tablet Take 1 tablet (50 mg total) by mouth every 6 (six) hours as  needed for severe pain (pain score 7-10). For AFTER surgery only, do not take and drive   No facility-administered encounter medications on file as of 09/08/2023.    ALLERGIES:  Allergies  Allergen Reactions   Vitamin E Rash    Other reaction(s): rash, itches   Aloe Aftersun [Dimethicone]    Bacitracin    Neosporin [Bacitracin-Polymyxin B] Rash     FAMILY HISTORY:  Family History  Problem Relation  Age of Onset   Breast cancer Mother 67   Heart murmur Mother    Heart disease Mother    Hypertension Father    Diabetes Father    Hypertension Sister    Hyperlipidemia Sister    Hypertension Brother    Hyperlipidemia Brother    Asthma Maternal Grandmother    Bone cancer Paternal Grandmother    Prostate cancer Neg Hx    Endometrial cancer Neg Hx    Ovarian cancer Neg Hx    Colon cancer Neg Hx    Pancreatic cancer Neg Hx      SOCIAL HISTORY:  Social Connections: Unknown (12/09/2021)   Received from [employer]   Social Network    Social Network: Not on file    REVIEW OF SYSTEMS:  + back pain Denies appetite changes, fevers, chills, fatigue, unexplained weight changes. Denies hearing loss, neck lumps or masses, mouth sores, ringing in ears or voice changes. Denies cough or wheezing.  Denies shortness of breath. Denies chest pain or palpitations. Denies leg swelling. Denies abdominal distention, pain, blood in stools, constipation, diarrhea, nausea, vomiting, or early satiety. Denies pain with intercourse, dysuria, frequency, hematuria or incontinence. Denies hot flashes, pelvic pain, vaginal bleeding or vaginal discharge.   Denies joint pain or muscle pain/cramps. Denies itching, rash, or wounds. Denies dizziness, headaches, numbness or seizures. Denies swollen lymph nodes or glands, denies easy bruising or bleeding. Denies anxiety, depression, confusion, or decreased concentration.  Physical Exam:  Vital Signs for this encounter:  Blood pressure (!) 136/96, pulse 62, temperature 98 F (36.7 C), temperature source Oral, resp. rate 18, height 5' 1 (1.549 m), weight 158 lb 6.4 oz (71.8 kg), SpO2 100%. Body mass index is 29.93 kg/m. General: Alert, oriented, no acute distress.  HEENT: Normocephalic, atraumatic. Sclera anicteric.  Chest: Clear to auscultation bilaterally. No wheezes, rhonchi, or rales. Cardiovascular: Regular rate and rhythm, no murmurs, rubs, or  gallops.  Abdomen: Normoactive bowel sounds. Soft, nondistended, nontender to palpation. No masses or hepatosplenomegaly appreciated. No palpable fluid wave.  Well-healed 4 cm incision below the umbilicus. Extremities: Grossly normal range of motion. Warm, well perfused. No edema bilaterally.  Skin: No rashes or lesions.  Lymphatics: No cervical, supraclavicular, or inguinal adenopathy.  GU:  Normal external female genitalia. No lesions. No discharge or bleeding.             Bladder/urethra:  No lesions or masses, well supported bladder             Vagina: Mild vaginal atrophy, no vaginal lesions.             Cervix: Normal appearing, no lesions. Atrophic.             Uterus: Small, mobile, no parametrial involvement or nodularity.             Adnexa: No masses appreciated.  Rectal: Deferred.  LABORATORY AND RADIOLOGIC DATA:  Outside medical records were reviewed to synthesize the above history, along with the history and physical obtained during the visit.   Lab Results  Component Value Date   WBC 6.7 07/15/2023   HGB 13.5 07/15/2023   HCT 41.4 07/15/2023   PLT 183.0 07/15/2023   GLUCOSE 107 (H) 07/15/2023   CHOL 194 11/18/2009   TRIG 103.0 11/18/2009   HDL 43.40 11/18/2009   LDLCALC 130 (H) 11/18/2009   ALT 20 07/15/2023   AST 20 07/15/2023   NA 141 07/15/2023   K 5.0 07/15/2023   CL 106 07/15/2023   CREATININE 0.79 07/15/2023   BUN 17 07/15/2023   CO2 29 07/15/2023

## 2023-09-08 ENCOUNTER — Ambulatory Visit: Payer: Self-pay | Admitting: Gynecologic Oncology

## 2023-09-08 ENCOUNTER — Inpatient Hospital Stay: Admitting: Gynecologic Oncology

## 2023-09-08 ENCOUNTER — Encounter (HOSPITAL_COMMUNITY)
Admission: RE | Admit: 2023-09-08 | Discharge: 2023-09-08 | Disposition: A | Discharge status: Home / Self Care | Source: Ambulatory Visit | Attending: Gynecologic Oncology | Admitting: Gynecologic Oncology

## 2023-09-08 ENCOUNTER — Encounter: Payer: Self-pay | Admitting: Gynecologic Oncology

## 2023-09-08 ENCOUNTER — Other Ambulatory Visit (HOSPITAL_COMMUNITY)

## 2023-09-08 ENCOUNTER — Inpatient Hospital Stay: Attending: Gynecologic Oncology | Admitting: Gynecologic Oncology

## 2023-09-08 VITALS — BP 132/80 | HR 62 | Temp 98.0°F | Resp 18 | Ht 61.0 in | Wt 158.4 lb

## 2023-09-08 DIAGNOSIS — K219 Gastro-esophageal reflux disease without esophagitis: Secondary | ICD-10-CM | POA: Diagnosis not present

## 2023-09-08 DIAGNOSIS — Z01818 Encounter for other preprocedural examination: Secondary | ICD-10-CM | POA: Insufficient documentation

## 2023-09-08 DIAGNOSIS — Z808 Family history of malignant neoplasm of other organs or systems: Secondary | ICD-10-CM | POA: Insufficient documentation

## 2023-09-08 DIAGNOSIS — M8580 Other specified disorders of bone density and structure, unspecified site: Secondary | ICD-10-CM | POA: Diagnosis not present

## 2023-09-08 DIAGNOSIS — C541 Malignant neoplasm of endometrium: Secondary | ICD-10-CM

## 2023-09-08 DIAGNOSIS — Z780 Asymptomatic menopausal state: Secondary | ICD-10-CM | POA: Insufficient documentation

## 2023-09-08 DIAGNOSIS — K5792 Diverticulitis of intestine, part unspecified, without perforation or abscess without bleeding: Secondary | ICD-10-CM | POA: Insufficient documentation

## 2023-09-08 DIAGNOSIS — Z6829 Body mass index (BMI) 29.0-29.9, adult: Secondary | ICD-10-CM | POA: Insufficient documentation

## 2023-09-08 DIAGNOSIS — E663 Overweight: Secondary | ICD-10-CM | POA: Diagnosis not present

## 2023-09-08 DIAGNOSIS — N950 Postmenopausal bleeding: Secondary | ICD-10-CM

## 2023-09-08 DIAGNOSIS — Z803 Family history of malignant neoplasm of breast: Secondary | ICD-10-CM | POA: Diagnosis not present

## 2023-09-08 LAB — CBC
HCT: 44.9 % (ref 36.0–46.0)
Hemoglobin: 14.3 g/dL (ref 12.0–15.0)
MCH: 29.5 pg (ref 26.0–34.0)
MCHC: 31.8 g/dL (ref 30.0–36.0)
MCV: 92.8 fL (ref 80.0–100.0)
Platelets: 208 K/uL (ref 150–400)
RBC: 4.84 MIL/uL (ref 3.87–5.11)
RDW: 13.2 % (ref 11.5–15.5)
WBC: 7.4 K/uL (ref 4.0–10.5)
nRBC: 0 % (ref 0.0–0.2)

## 2023-09-08 LAB — COMPREHENSIVE METABOLIC PANEL WITH GFR
ALT: 25 U/L (ref 0–44)
AST: 26 U/L (ref 15–41)
Albumin: 4.1 g/dL (ref 3.5–5.0)
Alkaline Phosphatase: 82 U/L (ref 38–126)
Anion gap: 7 (ref 5–15)
BUN: 19 mg/dL (ref 8–23)
CO2: 26 mmol/L (ref 22–32)
Calcium: 9.5 mg/dL (ref 8.9–10.3)
Chloride: 105 mmol/L (ref 98–111)
Creatinine, Ser: 0.73 mg/dL (ref 0.44–1.00)
GFR, Estimated: >60 mL/min (ref >60)
Glucose, Bld: 97 mg/dL (ref 70–99)
Potassium: 5.1 mmol/L (ref 3.5–5.1)
Sodium: 138 mmol/L (ref 135–145)
Total Bilirubin: 0.9 mg/dL (ref 0.0–1.2)
Total Protein: 7.8 g/dL (ref 6.5–8.1)

## 2023-09-08 MED ORDER — TRAMADOL HCL 50 MG PO TABS: 50.0000 mg | ORAL_TABLET | Freq: Four times a day (QID) | ORAL | 0 refills | Status: AC | PRN

## 2023-09-08 MED ORDER — SENNOSIDES-DOCUSATE SODIUM 8.6-50 MG PO TABS: 2.0000 | ORAL_TABLET | Freq: Every day | ORAL | 0 refills | Status: AC

## 2023-09-08 NOTE — Patient Instructions (Addendum)
 Preparing for your Surgery   Plan for surgery on September 15, 2023 with Dr. Comer Dollar at [facility]. You will be scheduled for robotic assisted total laparoscopic hysterectomy (removal of the uterus and cervix), bilateral salpingo-oophorectomy (removal of both ovaries and fallopian tubes), sentinel lymph node biopsy, possible lymph node dissection, possible laparotomy (larger incision on your abdomen if needed).   Pre-operative Testing -You will receive a phone call from presurgical testing at [facility] to arrange for a pre-operative appointment and lab work.   -Bring your insurance card, copy of an advanced directive if applicable, medication list   -At that visit, you will be asked to sign a consent for a possible blood transfusion in case a transfusion becomes necessary during surgery.  The need for a blood transfusion is rare but having consent is a necessary part of your care.      -You should not be taking blood thinners or aspirin at least ten days prior to surgery unless instructed by your surgeon.   -Do not take supplements such as fish oil (omega 3), red yeast rice, turmeric before your surgery. STOP TAKING AT LEAST 10 DAYS BEFORE SURGERY. You want to avoid medications with aspirin in them including headache powders such as BC or Goody's), Excedrin migraine.   Day Before Surgery at Home -You will be asked to take in a light diet the day before surgery. You will be advised you can have clear liquids up until 3 hours before your surgery.     Eat a light diet the day before surgery.  Examples including soups, broths, toast, yogurt, mashed potatoes.  AVOID GAS PRODUCING FOODS AND BEVERAGES. Things to avoid include carbonated beverages (fizzy beverages, sodas), raw fruits and raw vegetables (uncooked), or beans.    If your bowels are filled with gas, your surgeon will have difficulty visualizing your pelvic organs which increases your surgical risks.   Your role in  recovery Your role is to become active as soon as directed by your doctor, while still giving yourself time to heal.  Rest when you feel tired. You will be asked to do the following in order to speed your recovery:   - Cough and breathe deeply. This helps to clear and expand your lungs and can prevent pneumonia after surgery.  - STAY ACTIVE WHEN YOU GET HOME. Do mild physical activity. Walking or moving your legs help your circulation and body functions return to normal. Do not try to get up or walk alone the first time after surgery.   -If you develop swelling on one leg or the other, pain in the back of your leg, redness/warmth in one of your legs, please call the office or go to the Emergency Room to have a doppler to rule out a blood clot. For shortness of breath, chest pain-seek care in the Emergency Room as soon as possible. - Actively manage your pain. Managing your pain lets you move in comfort. We will ask you to rate your pain on a scale of zero to 10. It is your responsibility to tell your doctor or nurse where and how much you hurt so your pain can be treated.   Special Considerations -If you are diabetic, you may be placed on insulin after surgery to have closer control over your blood sugars to promote healing and recovery.  This does not mean that you will be discharged on insulin.  If applicable, your oral antidiabetics will be resumed when you are tolerating a  solid diet.   -Your final pathology results from surgery should be available around one week after surgery and the results will be relayed to you when available.   -FMLA forms can be faxed to [phone] and please allow 5-7 business days for completion.   Pain Management After Surgery -You will be prescribed your pain medication and bowel regimen medications before surgery so that you can have these available when you are discharged from the hospital. The pain medication is for use ONLY AFTER surgery and a new prescription  will not be given.    -Make sure that you have Tylenol  and Ibuprofen IF YOU ARE ABLE TO TAKE THESE MEDICATIONS at home to use on a regular basis after surgery for pain control. We recommend alternating the medications every hour to six hours since they work differently and are processed in the body differently for pain relief.   -Review the attached handout on narcotic use and their risks and side effects.    Bowel Regimen -You will be prescribed Sennakot-S to take nightly to prevent constipation especially if you are taking the narcotic pain medication intermittently.  It is important to prevent constipation and drink adequate amounts of liquids. You can stop taking this medication when you are not taking pain medication and you are back on your normal bowel routine.   Risks of Surgery Risks of surgery are low but include bleeding, infection, damage to surrounding structures, re-operation, blood clots, and very rarely death.     Blood Transfusion Information (For the consent to be signed before surgery)   We will be checking your blood type before surgery so in case of emergencies, we will know what type of blood you would need.                                             WHAT IS A BLOOD TRANSFUSION?   A transfusion is the replacement of blood or some of its parts. Blood is made up of multiple cells which provide different functions. Red blood cells carry oxygen and are used for blood loss replacement. White blood cells fight against infection. Platelets control bleeding. Plasma helps clot blood. Other blood products are available for specialized needs, such as hemophilia or other clotting disorders. BEFORE THE TRANSFUSION  Who gives blood for transfusions?  You may be able to donate blood to be used at a later date on yourself (autologous donation). Relatives can be asked to donate blood. This is generally not any safer than if you have received blood from a stranger. The same  precautions are taken to ensure safety when a relative's blood is donated. Healthy volunteers who are fully evaluated to make sure their blood is safe. This is blood bank blood. Transfusion therapy is the safest it has ever been in the practice of medicine. Before blood is taken from a donor, a complete history is taken to make sure that person has no history of diseases nor engages in risky social behavior (examples are intravenous drug use or sexual activity with multiple partners). The donor's travel history is screened to minimize risk of transmitting infections, such as malaria. The donated blood is tested for signs of infectious diseases, such as HIV and hepatitis. The blood is then tested to be sure it is compatible with you in order to minimize the chance of a transfusion reaction. If you or  a relative donates blood, this is often done in anticipation of surgery and is not appropriate for emergency situations. It takes many days to process the donated blood. RISKS AND COMPLICATIONS Although transfusion therapy is very safe and saves many lives, the main dangers of transfusion include:  Getting an infectious disease. Developing a transfusion reaction. This is an allergic reaction to something in the blood you were given. Every precaution is taken to prevent this. The decision to have a blood transfusion has been considered carefully by your caregiver before blood is given. Blood is not given unless the benefits outweigh the risks.   AFTER SURGERY INSTRUCTIONS   Return to work: 4-6 weeks if applicable   Activity: 1. Be up and out of the bed during the day.  Take a nap if needed.  You may walk up steps but be careful and use the hand rail.  Stair climbing will tire you more than you think, you may need to stop part way and rest.    2. No lifting or straining for 6 weeks over 10 pounds. No pushing, pulling, straining for 6 weeks.   3. No driving for 4-89 days when the following criteria have  been met: Do not drive if you are taking narcotic pain medicine and make sure that your reaction time has returned.    4. You can shower as soon as the next day after surgery. Shower daily.  Use your regular soap and water (not directly on the incision) and pat your incision(s) dry afterwards; don't rub.  No tub baths or submerging your body in water until cleared by your surgeon. If you have the soap that was given to you by pre-surgical testing that was used before surgery, you do not need to use it afterwards because this can irritate your incisions.    5. No sexual activity and nothing in the vagina for 12 weeks.   6. You may experience a small amount of clear drainage from your incisions, which is normal.  If the drainage persists, increases, or changes color please call the office.   7. Do not use creams, lotions, or ointments such as neosporin on your incisions after surgery until advised by your surgeon because they can cause removal of the dermabond glue on your incisions.     8. You may experience vaginal spotting after surgery or when the stitches at the top of the vagina begin to dissolve.  The spotting is normal but if you experience heavy bleeding, call our office.   9. Take Tylenol  or ibuprofen first for pain if you are able to take these medications and only use narcotic pain medication for severe pain not relieved by the Tylenol  or Ibuprofen.  Monitor your Tylenol  intake to a max of 4,000 mg in a 24 hour period. You can alternate these medications after surgery.   Diet: 1. Low sodium Heart Healthy Diet is recommended but you are cleared to resume your normal (before surgery) diet after your procedure.   2. It is safe to use a laxative, such as Miralax or Colace, if you have difficulty moving your bowels before surgery. You have been prescribed Sennakot-S to take at bedtime every evening after surgery to keep bowel movements regular and to prevent constipation.     Wound Care: 1.  Keep clean and dry.  Shower daily.   Reasons to call the Doctor: Fever - Oral temperature greater than 100.4 degrees Fahrenheit Foul-smelling vaginal discharge Difficulty urinating Nausea and vomiting Increased pain  at the site of the incision that is unrelieved with pain medicine. Difficulty breathing with or without chest pain New calf pain especially if only on one side Sudden, continuing increased vaginal bleeding with or without clots.   Contacts: For questions or concerns you should contact:   Dr. Comer Dollar at [phone]   Eleanor Epps, NP at [phone]   After Hours: call [phone] and have the GYN Oncologist paged/contacted (after 5 pm or on the weekends). You will speak with an after hours RN and let he or she know you have had surgery.   Messages sent via mychart are for non-urgent matters and are not responded to after hours so for urgent needs, please call the after hours number.

## 2023-09-08 NOTE — Patient Instructions (Addendum)
 SURGICAL WAITING ROOM VISITATION  Patients having surgery or a procedure may have no more than 2 support people in the waiting area - these visitors may rotate.    Children under the age of 21 must have an adult with them who is not the patient.  Visitors with respiratory illnesses are discouraged from visiting and should remain at home.  If the patient needs to stay at the hospital during part of their recovery, the visitor guidelines for inpatient rooms apply. Pre-op nurse will coordinate an appropriate time for 1 support person to accompany patient in pre-op.  This support person may not rotate.    Please refer to the [facility] website for the visitor guidelines for Inpatients (after your surgery is over and you are in a regular room).       Your procedure is scheduled on: Thursday, September 15, 2023   Report to [facility] Main Entrance    Report to admitting at 9:15 AM   Call this number if you have problems the morning of surgery [phone]   Light diet the day before (avoid gas producing foods and drinks like carbonated beverages).    Do not eat food :After Midnight.   After Midnight you may have the following liquids until 8:30 AM  DAY OF SURGERY  Water Non-Citrus Juices (without pulp, NO RED-Apple, White grape, White cranberry) Black Coffee (NO MILK/CREAM OR CREAMERS, sugar ok)  Clear Tea (NO MILK/CREAM OR CREAMERS, sugar ok) regular and decaf                             Plain Jell-O (NO RED)                                           Fruit ices (not with fruit pulp, NO RED)                                     Popsicles (NO RED)                                                               Sports drinks like Gatorade (NO RED)   FOLLOW BOWEL PREP AND ANY ADDITIONAL PRE OP INSTRUCTIONS YOU RECEIVED FROM YOUR SURGEON'S OFFICE!!!     Oral Hygiene is also important to reduce your risk of infection.                                    Remember - BRUSH YOUR TEETH  THE MORNING OF SURGERY WITH YOUR REGULAR TOOTHPASTE  DENTURES WILL BE REMOVED PRIOR TO SURGERY PLEASE DO NOT APPLY Poly grip OR ADHESIVES!!!   Do NOT smoke after Midnight   Stop all vitamins and herbal supplements 7 days before surgery.   Take these medicines the morning of surgery with A SIP OF WATER: Omeprazole   You may not have any metal on your body including hair pins, jewelry, and body piercing             Do not wear make-up, lotions, powders, perfumes/cologne, or deodorant  Do not wear nail polish including gel and S&S, artificial/acrylic nails, or any other type of covering on natural nails including finger and toenails. If you have artificial nails, gel coating, etc. that needs to be removed by a nail salon please have this removed prior to surgery or surgery may need to be canceled/ delayed if the surgeon/ anesthesia feels like they are unable to be safely monitored.   Do not shave  48 hours prior to surgery.    Do not bring valuables to the hospital. [facility] IS NOT             RESPONSIBLE   FOR VALUABLES.   Contacts, glasses, dentures or bridgework may not be worn into surgery.   Bring small overnight bag day of surgery.   DO NOT BRING YOUR HOME MEDICATIONS TO THE HOSPITAL. PHARMACY WILL DISPENSE MEDICATIONS LISTED ON YOUR MEDICATION LIST TO YOU DURING YOUR ADMISSION IN THE HOSPITAL!    Patients discharged on the day of surgery will not be allowed to drive home.  Someone NEEDS to stay with you for the first 24 hours after anesthesia.   Special Instructions: Bring a copy of your healthcare power of attorney and living will documents the day of surgery if you haven't scanned them before.              Please read over the following fact sheets you were given: IF YOU HAVE QUESTIONS ABOUT YOUR PRE-OP INSTRUCTIONS PLEASE CALL [phone]   If you received a COVID test during your pre-op visit  it is requested that you wear a mask when out  in public, stay away from anyone that may not be feeling well and notify your surgeon if you develop symptoms. If you test positive for Covid or have been in contact with anyone that has tested positive in the last 10 days please notify you surgeon.    [facility] - Preparing for Surgery Before surgery, you can play an important role.  Because skin is not sterile, your skin needs to be as free of germs as possible.  You can reduce the number of germs on your skin by washing with CHG (chlorahexidine gluconate) soap before surgery.  CHG is an antiseptic cleaner which kills germs and bonds with the skin to continue killing germs even after washing. Please DO NOT use if you have an allergy to CHG or antibacterial soaps.  If your skin becomes reddened/irritated stop using the CHG and inform your nurse when you arrive at Short Stay. Do not shave (including legs and underarms) for at least 48 hours prior to the first CHG shower.  You may shave your face/neck.  Please follow these instructions carefully:  1.  Shower with CHG Soap the night before surgery and the  morning of surgery.  2.  If you choose to wash your hair, wash your hair first as usual with your normal  shampoo.  3.  After you shampoo, rinse your hair and body thoroughly to remove the shampoo.                             4.  Use CHG as you would any other liquid soap.  You can apply chg directly to  the skin and wash.  Gently with a scrungie or clean washcloth.  5.  Apply the CHG Soap to your body ONLY FROM THE NECK DOWN.   Do   not use on face/ open                           Wound or open sores. Avoid contact with eyes, ears mouth and   genitals (private parts).                       Wash face,  Genitals (private parts) with your normal soap.             6.  Wash thoroughly, paying special attention to the area where your    surgery  will be performed.  7.  Thoroughly rinse your body with warm water from the neck down.  8.  DO NOT shower/wash  with your normal soap after using and rinsing off the CHG Soap.                9.  Pat yourself dry with a clean towel.            10.  Wear clean pajamas.            11.  Place clean sheets on your bed the night of your first shower and do not  sleep with pets. Day of Surgery : Do not apply any lotions/deodorants the morning of surgery.  Please wear clean clothes to the hospital/surgery center.  FAILURE TO FOLLOW THESE INSTRUCTIONS MAY RESULT IN THE CANCELLATION OF YOUR SURGERY  PATIENT SIGNATURE_________________________________  NURSE SIGNATURE__________________________________  ________________________________________________________________________ WHAT IS A BLOOD TRANSFUSION? Blood Transfusion Information  A transfusion is the replacement of blood or some of its parts. Blood is made up of multiple cells which provide different functions. Red blood cells carry oxygen and are used for blood loss replacement. White blood cells fight against infection. Platelets control bleeding. Plasma helps clot blood. Other blood products are available for specialized needs, such as hemophilia or other clotting disorders. BEFORE THE TRANSFUSION  Who gives blood for transfusions?  Healthy volunteers who are fully evaluated to make sure their blood is safe. This is blood bank blood. Transfusion therapy is the safest it has ever been in the practice of medicine. Before blood is taken from a donor, a complete history is taken to make sure that person has no history of diseases nor engages in risky social behavior (examples are intravenous drug use or sexual activity with multiple partners). The donor's travel history is screened to minimize risk of transmitting infections, such as malaria. The donated blood is tested for signs of infectious diseases, such as HIV and hepatitis. The blood is then tested to be sure it is compatible with you in order to minimize the chance of a transfusion reaction. If you or a  relative donates blood, this is often done in anticipation of surgery and is not appropriate for emergency situations. It takes many days to process the donated blood. RISKS AND COMPLICATIONS Although transfusion therapy is very safe and saves many lives, the main dangers of transfusion include:  Getting an infectious disease. Developing a transfusion reaction. This is an allergic reaction to something in the blood you were given. Every precaution is taken to prevent this. The decision to have a blood transfusion has been considered carefully by your caregiver before blood is given. Blood is not given  unless the benefits outweigh the risks. AFTER THE TRANSFUSION Right after receiving a blood transfusion, you will usually feel much better and more energetic. This is especially true if your red blood cells have gotten low (anemic). The transfusion raises the level of the red blood cells which carry oxygen, and this usually causes an energy increase. The nurse administering the transfusion will monitor you carefully for complications. HOME CARE INSTRUCTIONS  No special instructions are needed after a transfusion. You may find your energy is better. Speak with your caregiver about any limitations on activity for underlying diseases you may have. SEEK MEDICAL CARE IF:  Your condition is not improving after your transfusion. You develop redness or irritation at the intravenous (IV) site. SEEK IMMEDIATE MEDICAL CARE IF:  Any of the following symptoms occur over the next 12 hours: Shaking chills. You have a temperature by mouth above 102 F (38.9 C), not controlled by medicine. Chest, back, or muscle pain. People around you feel you are not acting correctly or are confused. Shortness of breath or difficulty breathing. Dizziness and fainting. You get a rash or develop hives. You have a decrease in urine output. Your urine turns a dark color or changes to pink, red, or brown. Any of the following  symptoms occur over the next 10 days: You have a temperature by mouth above 102 F (38.9 C), not controlled by medicine. Shortness of breath. Weakness after normal activity. The white part of the eye turns yellow (jaundice). You have a decrease in the amount of urine or are urinating less often. Your urine turns a dark color or changes to pink, red, or brown. Document Released: 02/13/2000 Document Revised: 05/10/2011 Document Reviewed: 10/02/2007 [facility] Patient Information 2014 [place], [place].  _______________________________________________________________________

## 2023-09-08 NOTE — Progress Notes (Signed)
 Patient here for new patient consultation with Dr. Viktoria and for a pre-operative appointment prior to her scheduled surgery on 09/15/2023. She is scheduled for robotic assisted total laparoscopic hysterectomy (removal of the uterus and cervix), bilateral salpingo-oophorectomy (removal of both ovaries and fallopian tubes), sentinel lymph node biopsy, possible lymph node dissection, possible laparotomy (larger incision on your abdomen if needed). The surgery was discussed in detail.  See after visit summary for additional details.    Discussed post-op pain management in detail including the aspects of the enhanced recovery pathway.  Advised her that a new prescription would be sent in for tramadol  and it is only to be used for after her upcoming surgery.  We discussed the use of tylenol  post-op and to monitor for a maximum of 4,000 mg in a 24 hour period.  Also prescribed sennakot to be used after surgery and to hold if having loose stools.  Discussed bowel regimen in detail.     Discussed measures to take at home to prevent DVT including frequent mobility.  Reportable signs and symptoms of DVT discussed. Post-operative instructions discussed and expectations for after surgery. Incisional care discussed as well including reportable signs and symptoms including erythema, drainage, wound separation.     10 minutes spent preparing information and with the patient.  Verbalizing understanding of material discussed. No needs or concerns voiced at the end of the visit.   Advised patient to call for any needs.  Advised that her post-operative medications had been prescribed and could be picked up at any time.    This appointment is included in the global surgical bundle as pre-operative teaching and has no charge.

## 2023-09-08 NOTE — Patient Instructions (Signed)
 Preparing for your Surgery   Plan for surgery on September 15, 2023 with Dr. Comer Dollar at [facility]. You will be scheduled for robotic assisted total laparoscopic hysterectomy (removal of the uterus and cervix), bilateral salpingo-oophorectomy (removal of both ovaries and fallopian tubes), sentinel lymph node biopsy, possible lymph node dissection, possible laparotomy (larger incision on your abdomen if needed).   Pre-operative Testing -You will receive a phone call from presurgical testing at [facility] to arrange for a pre-operative appointment and lab work.   -Bring your insurance card, copy of an advanced directive if applicable, medication list   -At that visit, you will be asked to sign a consent for a possible blood transfusion in case a transfusion becomes necessary during surgery.  The need for a blood transfusion is rare but having consent is a necessary part of your care.      -You should not be taking blood thinners or aspirin at least ten days prior to surgery unless instructed by your surgeon.   -Do not take supplements such as fish oil (omega 3), red yeast rice, turmeric before your surgery. STOP TAKING AT LEAST 10 DAYS BEFORE SURGERY. You want to avoid medications with aspirin in them including headache powders such as BC or Goody's), Excedrin migraine.   Day Before Surgery at Home -You will be asked to take in a light diet the day before surgery. You will be advised you can have clear liquids up until 3 hours before your surgery.     Eat a light diet the day before surgery.  Examples including soups, broths, toast, yogurt, mashed potatoes.  AVOID GAS PRODUCING FOODS AND BEVERAGES. Things to avoid include carbonated beverages (fizzy beverages, sodas), raw fruits and raw vegetables (uncooked), or beans.    If your bowels are filled with gas, your surgeon will have difficulty visualizing your pelvic organs which increases your surgical risks.   Your role in  recovery Your role is to become active as soon as directed by your doctor, while still giving yourself time to heal.  Rest when you feel tired. You will be asked to do the following in order to speed your recovery:   - Cough and breathe deeply. This helps to clear and expand your lungs and can prevent pneumonia after surgery.  - STAY ACTIVE WHEN YOU GET HOME. Do mild physical activity. Walking or moving your legs help your circulation and body functions return to normal. Do not try to get up or walk alone the first time after surgery.   -If you develop swelling on one leg or the other, pain in the back of your leg, redness/warmth in one of your legs, please call the office or go to the Emergency Room to have a doppler to rule out a blood clot. For shortness of breath, chest pain-seek care in the Emergency Room as soon as possible. - Actively manage your pain. Managing your pain lets you move in comfort. We will ask you to rate your pain on a scale of zero to 10. It is your responsibility to tell your doctor or nurse where and how much you hurt so your pain can be treated.   Special Considerations -If you are diabetic, you may be placed on insulin after surgery to have closer control over your blood sugars to promote healing and recovery.  This does not mean that you will be discharged on insulin.  If applicable, your oral antidiabetics will be resumed when you are tolerating a  solid diet.   -Your final pathology results from surgery should be available around one week after surgery and the results will be relayed to you when available.   -FMLA forms can be faxed to [phone] and please allow 5-7 business days for completion.   Pain Management After Surgery -You will be prescribed your pain medication and bowel regimen medications before surgery so that you can have these available when you are discharged from the hospital. The pain medication is for use ONLY AFTER surgery and a new prescription  will not be given.    -Make sure that you have Tylenol  and Ibuprofen IF YOU ARE ABLE TO TAKE THESE MEDICATIONS at home to use on a regular basis after surgery for pain control. We recommend alternating the medications every hour to six hours since they work differently and are processed in the body differently for pain relief.   -Review the attached handout on narcotic use and their risks and side effects.    Bowel Regimen -You will be prescribed Sennakot-S to take nightly to prevent constipation especially if you are taking the narcotic pain medication intermittently.  It is important to prevent constipation and drink adequate amounts of liquids. You can stop taking this medication when you are not taking pain medication and you are back on your normal bowel routine.   Risks of Surgery Risks of surgery are low but include bleeding, infection, damage to surrounding structures, re-operation, blood clots, and very rarely death.     Blood Transfusion Information (For the consent to be signed before surgery)   We will be checking your blood type before surgery so in case of emergencies, we will know what type of blood you would need.                                             WHAT IS A BLOOD TRANSFUSION?   A transfusion is the replacement of blood or some of its parts. Blood is made up of multiple cells which provide different functions. Red blood cells carry oxygen and are used for blood loss replacement. White blood cells fight against infection. Platelets control bleeding. Plasma helps clot blood. Other blood products are available for specialized needs, such as hemophilia or other clotting disorders. BEFORE THE TRANSFUSION  Who gives blood for transfusions?  You may be able to donate blood to be used at a later date on yourself (autologous donation). Relatives can be asked to donate blood. This is generally not any safer than if you have received blood from a stranger. The same  precautions are taken to ensure safety when a relative's blood is donated. Healthy volunteers who are fully evaluated to make sure their blood is safe. This is blood bank blood. Transfusion therapy is the safest it has ever been in the practice of medicine. Before blood is taken from a donor, a complete history is taken to make sure that person has no history of diseases nor engages in risky social behavior (examples are intravenous drug use or sexual activity with multiple partners). The donor's travel history is screened to minimize risk of transmitting infections, such as malaria. The donated blood is tested for signs of infectious diseases, such as HIV and hepatitis. The blood is then tested to be sure it is compatible with you in order to minimize the chance of a transfusion reaction. If you or  a relative donates blood, this is often done in anticipation of surgery and is not appropriate for emergency situations. It takes many days to process the donated blood. RISKS AND COMPLICATIONS Although transfusion therapy is very safe and saves many lives, the main dangers of transfusion include:  Getting an infectious disease. Developing a transfusion reaction. This is an allergic reaction to something in the blood you were given. Every precaution is taken to prevent this. The decision to have a blood transfusion has been considered carefully by your caregiver before blood is given. Blood is not given unless the benefits outweigh the risks.   AFTER SURGERY INSTRUCTIONS   Return to work: 4-6 weeks if applicable   Activity: 1. Be up and out of the bed during the day.  Take a nap if needed.  You may walk up steps but be careful and use the hand rail.  Stair climbing will tire you more than you think, you may need to stop part way and rest.    2. No lifting or straining for 6 weeks over 10 pounds. No pushing, pulling, straining for 6 weeks.   3. No driving for 4-89 days when the following criteria have  been met: Do not drive if you are taking narcotic pain medicine and make sure that your reaction time has returned.    4. You can shower as soon as the next day after surgery. Shower daily.  Use your regular soap and water (not directly on the incision) and pat your incision(s) dry afterwards; don't rub.  No tub baths or submerging your body in water until cleared by your surgeon. If you have the soap that was given to you by pre-surgical testing that was used before surgery, you do not need to use it afterwards because this can irritate your incisions.    5. No sexual activity and nothing in the vagina for 12 weeks.   6. You may experience a small amount of clear drainage from your incisions, which is normal.  If the drainage persists, increases, or changes color please call the office.   7. Do not use creams, lotions, or ointments such as neosporin on your incisions after surgery until advised by your surgeon because they can cause removal of the dermabond glue on your incisions.     8. You may experience vaginal spotting after surgery or when the stitches at the top of the vagina begin to dissolve.  The spotting is normal but if you experience heavy bleeding, call our office.   9. Take Tylenol  or ibuprofen first for pain if you are able to take these medications and only use narcotic pain medication for severe pain not relieved by the Tylenol  or Ibuprofen.  Monitor your Tylenol  intake to a max of 4,000 mg in a 24 hour period. You can alternate these medications after surgery.   Diet: 1. Low sodium Heart Healthy Diet is recommended but you are cleared to resume your normal (before surgery) diet after your procedure.   2. It is safe to use a laxative, such as Miralax or Colace, if you have difficulty moving your bowels before surgery. You have been prescribed Sennakot-S to take at bedtime every evening after surgery to keep bowel movements regular and to prevent constipation.     Wound Care: 1.  Keep clean and dry.  Shower daily.   Reasons to call the Doctor: Fever - Oral temperature greater than 100.4 degrees Fahrenheit Foul-smelling vaginal discharge Difficulty urinating Nausea and vomiting Increased pain  at the site of the incision that is unrelieved with pain medicine. Difficulty breathing with or without chest pain New calf pain especially if only on one side Sudden, continuing increased vaginal bleeding with or without clots.   Contacts: For questions or concerns you should contact:   Dr. Comer Dollar at [phone]   Eleanor Epps, NP at [phone]   After Hours: call [phone] and have the GYN Oncologist paged/contacted (after 5 pm or on the weekends). You will speak with an after hours RN and let he or she know you have had surgery.   Messages sent via mychart are for non-urgent matters and are not responded to after hours so for urgent needs, please call the after hours number.

## 2023-09-09 ENCOUNTER — Other Ambulatory Visit (HOSPITAL_COMMUNITY)

## 2023-09-12 ENCOUNTER — Encounter (HOSPITAL_COMMUNITY): Admission: RE | Admit: 2023-09-12 | Source: Ambulatory Visit

## 2023-09-12 ENCOUNTER — Other Ambulatory Visit (HOSPITAL_COMMUNITY)

## 2023-09-12 ENCOUNTER — Encounter: Payer: Self-pay | Admitting: Obstetrics and Gynecology

## 2023-09-12 ENCOUNTER — Inpatient Hospital Stay: Admitting: Licensed Clinical Social Worker

## 2023-09-12 NOTE — Progress Notes (Signed)
 [facility] Clinical Social Work  Initial Assessment   Karina Aguirre is a 63 y.o. year old female contacted by phone. Clinical Social Work was referred by new patient protocol for assessment of psychosocial needs.   SDOH (Social Determinants of Health) assessments performed: Yes SDOH Interventions    Flowsheet Row Clinical Support from 09/12/2023 in [facility] - A Dept Of [facility]. [facility]  SDOH Interventions   Food Insecurity Interventions Intervention Not Indicated  Housing Interventions Intervention Not Indicated  Transportation Interventions Intervention Not Indicated    SDOH Screenings   Food Insecurity: No Food Insecurity (09/12/2023)  Housing: Low Risk  (09/12/2023)  Transportation Needs: No Transportation Needs (09/12/2023)  Social Connections: Unknown (12/09/2021)   Received from [facility]  Tobacco Use: Low Risk  (09/08/2023)     Distress Screen completed: No     No data to display            Family/Social Information:  Housing Arrangement: patient lives with boyfriend/partner of 20+ years Family members/support persons in your life? Family and Friends Transportation concerns: no  Employment: Working full time for [employer]. Has sick time, vacation time, and short-term disability if needed.  Income source: Employment Financial concerns: No Type of concern: None Food access concerns: no Religious or spiritual practice: Yes-has faith  Advanced directives: Not known Services Currently in place:  [employer]  Coping/ Adjustment to diagnosis: Patient understands treatment plan and what happens next? yes, preparing for surgery this week. She is nervous but hopeful that the cancer is contained. Concerns about diagnosis and/or treatment: Afraid of cancer Patient reported stressors: Adjusting to my illness Hopes and/or priorities: hopes cancer is contained  Patient enjoys time with family/ friends Current coping skills/ strengths: Ability for insight  , Capable of independent living , Communication skills , Motivation for treatment/growth , Religious Affiliation , and Supportive family/friends     SUMMARY: Current SDOH Barriers:  No major barriers. Emotional adjustment to cancer diagnosis  Clinical Social Work Clinical Goal(s):  Patient will work with SW to address concerns related to adjustment to cancer  Interventions: Discussed common feeling and emotions when being diagnosed with cancer, and the importance of support during treatment Informed patient of the support team roles and support services at [facility] Provided CSW contact information and encouraged patient to call with any questions or concerns   Follow Up Plan: CSW will follow-up with patient by phone 1-2 weeks post surgery Patient verbalizes understanding of plan: Yes    Tacha Manni E Dezi Schaner, LCSW Clinical Social Worker [facility]

## 2023-09-13 ENCOUNTER — Other Ambulatory Visit: Payer: Self-pay

## 2023-09-13 ENCOUNTER — Encounter (HOSPITAL_COMMUNITY): Payer: Self-pay

## 2023-09-13 ENCOUNTER — Telehealth: Payer: Self-pay

## 2023-09-13 ENCOUNTER — Encounter (HOSPITAL_COMMUNITY)
Admission: RE | Admit: 2023-09-13 | Discharge: 2023-09-13 | Disposition: A | Discharge status: Home / Self Care | Source: Ambulatory Visit | Attending: Gynecologic Oncology | Admitting: Gynecologic Oncology

## 2023-09-13 HISTORY — DX: Unspecified osteoarthritis, unspecified site: M19.90

## 2023-09-13 HISTORY — DX: Malignant neoplasm of endometrium: C54.1

## 2023-09-13 NOTE — Telephone Encounter (Signed)
 Cancer policy form received on 7/10 via patient bringing to office. Requested information provided.  Per Ms.Howland, ok to give to her niece, April, to bring to her home.

## 2023-09-13 NOTE — Progress Notes (Signed)
  Date of COVID positive in last 90 days:  PCP - Gaither Langton, MD Cardiologist - N/A  Chest x-ray - N/A EKG - N/A Stress Test - 10+ years ago ECHO - N/A Cardiac Cath - N/A Pacemaker/ICD device last checked: N/A Spinal Cord Stimulator:  N/A  Bowel Prep - Light diet the day before avoid gas producing foods   Sleep Study - N/A CPAP -   Fasting Blood Sugar - N/A Checks Blood Sugar _____ times a day  Last dose of GLP1 agonist-  N/A GLP1 instructions:  Do not take after     Last dose of SGLT-2 inhibitors-  N/A SGLT-2 instructions:  Do not take after    Blood Thinner Instructions: N/A Aspirin Instructions: Last Dose:  Activity level:  Can go up a flight of stairs and perform activities of daily living without stopping and without symptoms of chest pain or shortness of breath.  Anesthesia review: N/A  Patient denies shortness of breath, fever, cough and chest pain at PAT appointment  Patient verbalized understanding of instructions that were given to them at the PAT appointment. Patient was also instructed that they will need to review over the PAT instructions again at home before surgery.

## 2023-09-14 ENCOUNTER — Telehealth: Payer: Self-pay | Admitting: *Deleted

## 2023-09-14 NOTE — Telephone Encounter (Signed)
 Attempted to reach patient for pre-op call. Left voicemail requesting call back.

## 2023-09-14 NOTE — Telephone Encounter (Signed)
Telephone call to check on pre-operative status.  Patient compliant with pre-operative instructions.  Reinforced nothing to eat after midnight. Clear liquids until 0815. Patient to arrive at 0915.  No questions or concerns voiced.  Instructed to call for any needs.  °

## 2023-09-15 ENCOUNTER — Encounter (HOSPITAL_COMMUNITY)
Admission: RE | Disposition: A | Discharge status: Home / Self Care | Payer: Self-pay | Source: Home / Self Care | Attending: Gynecologic Oncology

## 2023-09-15 ENCOUNTER — Ambulatory Visit (HOSPITAL_COMMUNITY): Admitting: Registered Nurse

## 2023-09-15 ENCOUNTER — Ambulatory Visit (HOSPITAL_COMMUNITY)
Admission: RE | Admit: 2023-09-15 | Discharge: 2023-09-15 | Disposition: A | Discharge status: Home / Self Care | Attending: Gynecologic Oncology | Admitting: Gynecologic Oncology

## 2023-09-15 ENCOUNTER — Other Ambulatory Visit: Payer: Self-pay

## 2023-09-15 ENCOUNTER — Encounter (HOSPITAL_COMMUNITY): Payer: Self-pay | Admitting: Gynecologic Oncology

## 2023-09-15 ENCOUNTER — Other Ambulatory Visit: Payer: Self-pay | Admitting: Gynecologic Oncology

## 2023-09-15 DIAGNOSIS — N888 Other specified noninflammatory disorders of cervix uteri: Secondary | ICD-10-CM | POA: Diagnosis not present

## 2023-09-15 DIAGNOSIS — Z9889 Other specified postprocedural states: Secondary | ICD-10-CM | POA: Diagnosis not present

## 2023-09-15 DIAGNOSIS — K219 Gastro-esophageal reflux disease without esophagitis: Secondary | ICD-10-CM | POA: Diagnosis not present

## 2023-09-15 DIAGNOSIS — K5900 Constipation, unspecified: Secondary | ICD-10-CM | POA: Insufficient documentation

## 2023-09-15 DIAGNOSIS — C541 Malignant neoplasm of endometrium: Secondary | ICD-10-CM | POA: Diagnosis present

## 2023-09-15 HISTORY — PX: ROBOTIC ASSISTED TOTAL HYSTERECTOMY WITH BILATERAL SALPINGO OOPHERECTOMY: SHX6086

## 2023-09-15 HISTORY — PX: INJECTION, FOR SENTINEL LYMPH NODE IDENTIFICATION: SHX7598

## 2023-09-15 LAB — TYPE AND SCREEN
ABO/RH(D): A POS
Antibody Screen: NEGATIVE

## 2023-09-15 LAB — ABO/RH: ABO/RH(D): A POS

## 2023-09-15 SURGERY — HYSTERECTOMY, TOTAL, ROBOT-ASSISTED, LAPAROSCOPIC, WITH BILATERAL SALPINGO-OOPHORECTOMY
Anesthesia: General | Site: Abdomen

## 2023-09-15 MED ORDER — SCOPOLAMINE 1 MG/3DAYS TD PT72
1.0000 | MEDICATED_PATCH | TRANSDERMAL | Status: DC
  Administered 2023-09-15: 1.5 mg via TRANSDERMAL

## 2023-09-15 MED ORDER — ONDANSETRON HCL 4 MG/2ML IJ SOLN
INTRAMUSCULAR | Status: DC | PRN
  Administered 2023-09-15: 4 mg via INTRAVENOUS

## 2023-09-15 MED ORDER — METRONIDAZOLE 500 MG/100ML IV SOLN
INTRAVENOUS | Status: DC | PRN
  Administered 2023-09-15: 500 mg via INTRAVENOUS

## 2023-09-15 MED ORDER — ROCURONIUM BROMIDE 100 MG/10ML IV SOLN
INTRAVENOUS | Status: DC | PRN
  Administered 2023-09-15: 20 mg via INTRAVENOUS
  Administered 2023-09-15: 50 mg via INTRAVENOUS

## 2023-09-15 MED ORDER — EPHEDRINE SULFATE (PRESSORS) 50 MG/ML IJ SOLN
INTRAMUSCULAR | Status: DC | PRN
  Administered 2023-09-15: 10 mg via INTRAVENOUS

## 2023-09-15 MED ORDER — SCOPOLAMINE 1 MG/3DAYS TD PT72
MEDICATED_PATCH | TRANSDERMAL | Status: AC
Start: 2023-09-15 — End: 2023-09-15
  Filled 2023-09-15: qty 1

## 2023-09-15 MED ORDER — METRONIDAZOLE 500 MG/100ML IV SOLN
INTRAVENOUS | Status: AC
  Filled 2023-09-15: qty 100

## 2023-09-15 MED ORDER — ACETAMINOPHEN 10 MG/ML IV SOLN: 1000.0000 mg | Freq: Once | INTRAVENOUS | Status: DC | PRN

## 2023-09-15 MED ORDER — LACTATED RINGERS IR SOLN
Status: DC | PRN
  Administered 2023-09-15: 1000 mL

## 2023-09-15 MED ORDER — DROPERIDOL 2.5 MG/ML IJ SOLN
INTRAMUSCULAR | Status: AC
  Filled 2023-09-15: qty 2

## 2023-09-15 MED ORDER — PROPOFOL 10 MG/ML IV BOLUS
INTRAVENOUS | Status: DC | PRN
Start: 2023-09-15 — End: 2023-09-15
  Administered 2023-09-15: 150 mg via INTRAVENOUS

## 2023-09-15 MED ORDER — LIDOCAINE HCL (CARDIAC) PF 100 MG/5ML IV SOSY
PREFILLED_SYRINGE | INTRAVENOUS | Status: DC | PRN
Start: 2023-09-15 — End: 2023-09-15
  Administered 2023-09-15: 40 mg via INTRAVENOUS

## 2023-09-15 MED ORDER — SUGAMMADEX SODIUM 200 MG/2ML IV SOLN
INTRAVENOUS | Status: DC | PRN
  Administered 2023-09-15: 200 mg via INTRAVENOUS

## 2023-09-15 MED ORDER — KETOROLAC TROMETHAMINE 30 MG/ML IJ SOLN
INTRAMUSCULAR | Status: AC
  Filled 2023-09-15: qty 1

## 2023-09-15 MED ORDER — CEFAZOLIN SODIUM-DEXTROSE 2-4 GM/100ML-% IV SOLN
INTRAVENOUS | Status: AC
  Filled 2023-09-15: qty 100

## 2023-09-15 MED ORDER — BUPIVACAINE HCL 0.25 % IJ SOLN
INTRAMUSCULAR | Status: DC | PRN
  Administered 2023-09-15: 30 mL

## 2023-09-15 MED ORDER — DEXAMETHASONE SODIUM PHOSPHATE 10 MG/ML IJ SOLN
INTRAMUSCULAR | Status: AC
  Filled 2023-09-15: qty 1

## 2023-09-15 MED ORDER — LIDOCAINE 2% (20 MG/ML) 5 ML SYRINGE
INTRAMUSCULAR | Status: DC | PRN
  Administered 2023-09-15: 1.5 mg/kg/h via INTRAVENOUS

## 2023-09-15 MED ORDER — FENTANYL CITRATE (PF) 100 MCG/2ML IJ SOLN
INTRAMUSCULAR | Status: AC
  Filled 2023-09-15: qty 2

## 2023-09-15 MED ORDER — LIDOCAINE HCL (PF) 2 % IJ SOLN
INTRAMUSCULAR | Status: AC
  Filled 2023-09-15: qty 5

## 2023-09-15 MED ORDER — PHENYLEPHRINE HCL-NACL 20-0.9 MG/250ML-% IV SOLN
INTRAVENOUS | Status: DC | PRN
  Administered 2023-09-15: 50 ug/min via INTRAVENOUS

## 2023-09-15 MED ORDER — FENTANYL CITRATE PF 50 MCG/ML IJ SOSY
25.0000 ug | PREFILLED_SYRINGE | INTRAMUSCULAR | Status: DC | PRN
  Administered 2023-09-15 (×2): 50 ug via INTRAVENOUS

## 2023-09-15 MED ORDER — DROPERIDOL 2.5 MG/ML IJ SOLN
0.6250 mg | Freq: Once | INTRAMUSCULAR | Status: AC | PRN
  Administered 2023-09-15: 0.625 mg via INTRAVENOUS

## 2023-09-15 MED ORDER — FENTANYL CITRATE PF 50 MCG/ML IJ SOSY
PREFILLED_SYRINGE | INTRAMUSCULAR | Status: AC
  Filled 2023-09-15: qty 1

## 2023-09-15 MED ORDER — HEPARIN SODIUM (PORCINE) 5000 UNIT/ML IJ SOLN
INTRAMUSCULAR | Status: AC
  Administered 2023-09-15: 5000 [IU] via SUBCUTANEOUS
  Filled 2023-09-15: qty 1

## 2023-09-15 MED ORDER — ONDANSETRON HCL 4 MG/2ML IJ SOLN
INTRAMUSCULAR | Status: AC
  Filled 2023-09-15: qty 2

## 2023-09-15 MED ORDER — STERILE WATER FOR INJECTION IJ SOLN
INTRAMUSCULAR | Status: DC | PRN
  Administered 2023-09-15: 10 mL

## 2023-09-15 MED ORDER — METRONIDAZOLE 500 MG/100ML IV SOLN: 500.0000 mg | INTRAVENOUS | Status: DC

## 2023-09-15 MED ORDER — STERILE WATER FOR INJECTION IJ SOLN
INTRAMUSCULAR | Status: AC
  Filled 2023-09-15: qty 10

## 2023-09-15 MED ORDER — MIDAZOLAM HCL 2 MG/2ML IJ SOLN
INTRAMUSCULAR | Status: AC
  Filled 2023-09-15: qty 2

## 2023-09-15 MED ORDER — FENTANYL CITRATE (PF) 100 MCG/2ML IJ SOLN
INTRAMUSCULAR | Status: DC | PRN
  Administered 2023-09-15 (×2): 50 ug via INTRAVENOUS
  Administered 2023-09-15: 100 ug via INTRAVENOUS

## 2023-09-15 MED ORDER — PROPOFOL 1000 MG/100ML IV EMUL
INTRAVENOUS | Status: AC
  Filled 2023-09-15: qty 100

## 2023-09-15 MED ORDER — DEXAMETHASONE SODIUM PHOSPHATE 10 MG/ML IJ SOLN
INTRAMUSCULAR | Status: DC | PRN
  Administered 2023-09-15: 10 mg via INTRAVENOUS

## 2023-09-15 MED ORDER — ROCURONIUM BROMIDE 10 MG/ML (PF) SYRINGE
PREFILLED_SYRINGE | INTRAVENOUS | Status: AC
  Filled 2023-09-15: qty 10

## 2023-09-15 MED ORDER — BUPIVACAINE HCL (PF) 0.25 % IJ SOLN
INTRAMUSCULAR | Status: AC
  Filled 2023-09-15: qty 30

## 2023-09-15 MED ORDER — ACETAMINOPHEN 500 MG PO TABS
ORAL_TABLET | ORAL | Status: AC
  Filled 2023-09-15: qty 2

## 2023-09-15 MED ORDER — LACTATED RINGERS IV SOLN: INTRAVENOUS | Status: DC

## 2023-09-15 MED ORDER — OXYCODONE HCL 5 MG/5ML PO SOLN: 5.0000 mg | Freq: Once | ORAL | Status: DC | PRN

## 2023-09-15 MED ORDER — MIDAZOLAM HCL 5 MG/5ML IJ SOLN
INTRAMUSCULAR | Status: DC | PRN
  Administered 2023-09-15: 2 mg via INTRAVENOUS

## 2023-09-15 MED ORDER — ONDANSETRON 4 MG PO TBDP
ORAL_TABLET | ORAL | Status: AC
  Filled 2023-09-15: qty 1

## 2023-09-15 MED ORDER — PROPOFOL 500 MG/50ML IV EMUL
INTRAVENOUS | Status: DC | PRN
  Administered 2023-09-15: 50 ug/kg/min via INTRAVENOUS

## 2023-09-15 MED ORDER — ONDANSETRON 4 MG PO TBDP: 4.0000 mg | ORAL_TABLET | Freq: Three times a day (TID) | ORAL | 0 refills | Status: AC | PRN

## 2023-09-15 MED ORDER — ACETAMINOPHEN 500 MG PO TABS
1000.0000 mg | ORAL_TABLET | ORAL | Status: AC
  Administered 2023-09-15: 1000 mg via ORAL

## 2023-09-15 MED ORDER — CHLORHEXIDINE GLUCONATE 0.12 % MT SOLN: 15.0000 mL | Freq: Once | OROMUCOSAL | Status: DC

## 2023-09-15 MED ORDER — STERILE WATER FOR IRRIGATION IR SOLN
Status: DC | PRN
  Administered 2023-09-15: 1000 mL

## 2023-09-15 MED ORDER — ONDANSETRON 4 MG PO TBDP
4.0000 mg | ORAL_TABLET | Freq: Once | ORAL | Status: AC
  Administered 2023-09-15: 4 mg via ORAL

## 2023-09-15 MED ORDER — OXYCODONE HCL 5 MG PO TABS: 5.0000 mg | ORAL_TABLET | Freq: Once | ORAL | Status: DC | PRN

## 2023-09-15 MED ORDER — KETOROLAC TROMETHAMINE 15 MG/ML IJ SOLN
INTRAMUSCULAR | Status: DC | PRN
  Administered 2023-09-15: 15 mg via INTRAVENOUS

## 2023-09-15 MED ORDER — HEPARIN SODIUM (PORCINE) 5000 UNIT/ML IJ SOLN: 5000.0000 [IU] | INTRAMUSCULAR | Status: AC

## 2023-09-15 MED ORDER — CEFAZOLIN SODIUM-DEXTROSE 2-4 GM/100ML-% IV SOLN
2.0000 g | INTRAVENOUS | Status: AC
  Administered 2023-09-15: 2 g via INTRAVENOUS

## 2023-09-15 MED ORDER — DEXAMETHASONE SODIUM PHOSPHATE 10 MG/ML IJ SOLN: 4.0000 mg | INTRAMUSCULAR | Status: DC

## 2023-09-15 SURGICAL SUPPLY — 66 items
APPLICATOR SURGIFLO ENDO (HEMOSTASIS) IMPLANT
BAG COUNTER SPONGE SURGICOUNT (BAG) IMPLANT
BAG LAPAROSCOPIC 12 15 PORT 16 (BASKET) IMPLANT
BLADE SURG SZ10 CARB STEEL (BLADE) IMPLANT
COVER BACK TABLE 60X90IN (DRAPES) ×4 IMPLANT
COVER TIP SHEARS 8 DVNC (MISCELLANEOUS) ×4 IMPLANT
DERMABOND ADVANCED .7 DNX12 (GAUZE/BANDAGES/DRESSINGS) ×4 IMPLANT
DRAPE ARM DVNC X/XI (DISPOSABLE) ×16 IMPLANT
DRAPE COLUMN DVNC XI (DISPOSABLE) ×4 IMPLANT
DRAPE SHEET LG 3/4 BI-LAMINATE (DRAPES) ×4 IMPLANT
DRAPE SURG IRRIG POUCH 19X23 (DRAPES) ×4 IMPLANT
DRIVER NDL MEGA SUTCUT DVNCXI (INSTRUMENTS) ×3 IMPLANT
DRIVER NDLE MEGA SUTCUT DVNCXI (INSTRUMENTS) ×3 IMPLANT
DRSG OPSITE POSTOP 4X6 (GAUZE/BANDAGES/DRESSINGS) IMPLANT
DRSG OPSITE POSTOP 4X8 (GAUZE/BANDAGES/DRESSINGS) IMPLANT
ELECT PENCIL ROCKER SW 15FT (MISCELLANEOUS) IMPLANT
ELECT REM PT RETURN 15FT ADLT (MISCELLANEOUS) ×4 IMPLANT
FORCEPS BPLR FENES DVNC XI (FORCEP) ×4 IMPLANT
FORCEPS PROGRASP DVNC XI (FORCEP) ×4 IMPLANT
GAUZE 4X4 16PLY ~~LOC~~+RFID DBL (SPONGE) ×8 IMPLANT
GLOVE BIO SURGEON STRL SZ 6 (GLOVE) ×16 IMPLANT
GLOVE BIO SURGEON STRL SZ 6.5 (GLOVE) ×4 IMPLANT
GOWN STRL REUS W/ TWL LRG LVL3 (GOWN DISPOSABLE) ×16 IMPLANT
GRASPER SUT TROCAR 14GX15 (MISCELLANEOUS) ×1 IMPLANT
HOLDER FOLEY CATH W/STRAP (MISCELLANEOUS) IMPLANT
IRRIGATION SUCT STRKRFLW 2 WTP (MISCELLANEOUS) ×4 IMPLANT
KIT PROCEDURE DVNC SI (MISCELLANEOUS) ×1 IMPLANT
KIT TURNOVER KIT A (KITS) ×4 IMPLANT
LIGASURE IMPACT 36 18CM CVD LR (INSTRUMENTS) IMPLANT
MANIPULATOR ADVINCU DEL 3.0 PL (MISCELLANEOUS) IMPLANT
MANIPULATOR ADVINCU DEL 3.5 PL (MISCELLANEOUS) IMPLANT
MANIPULATOR UTERINE 4.5 ZUMI (MISCELLANEOUS) ×1 IMPLANT
NDL HYPO 21X1.5 SAFETY (NEEDLE) ×3 IMPLANT
NDL SPNL 18GX3.5 QUINCKE PK (NEEDLE) IMPLANT
NEEDLE HYPO 21X1.5 SAFETY (NEEDLE) ×3 IMPLANT
NEEDLE SPNL 18GX3.5 QUINCKE PK (NEEDLE) IMPLANT
OBTURATOR OPTICALSTD 8 DVNC (TROCAR) ×4 IMPLANT
PACK ROBOT GYN CUSTOM WL (TRAY / TRAY PROCEDURE) ×4 IMPLANT
PAD POSITIONING PINK XL (MISCELLANEOUS) ×4 IMPLANT
PORT ACCESS TROCAR AIRSEAL 12 (TROCAR) ×1 IMPLANT
SCISSORS MNPLR CVD DVNC XI (INSTRUMENTS) ×4 IMPLANT
SCRUB CHG 4% DYNA-HEX 4OZ (MISCELLANEOUS) ×2 IMPLANT
SEAL UNIV 5-12 XI (MISCELLANEOUS) ×16 IMPLANT
SET TRI-LUMEN FLTR TB AIRSEAL (TUBING) ×4 IMPLANT
SPIKE FLUID TRANSFER (MISCELLANEOUS) ×4 IMPLANT
SPONGE T-LAP 18X18 ~~LOC~~+RFID (SPONGE) IMPLANT
SURGIFLO W/THROMBIN 8M KIT (HEMOSTASIS) IMPLANT
SUT MNCRL AB 4-0 PS2 18 (SUTURE) IMPLANT
SUT PDS AB 1 TP1 96 (SUTURE) IMPLANT
SUT STRATA PDS 0 30 CT-2.5 (SUTURE) IMPLANT
SUT V-LOC 180 0-0 GS22 (SUTURE) IMPLANT
SUT VIC AB 0 CT1 27XBRD ANTBC (SUTURE) IMPLANT
SUT VIC AB 2-0 CT1 TAPERPNT 27 (SUTURE) IMPLANT
SUT VIC AB 2-0 SH 27X BRD (SUTURE) IMPLANT
SUT VIC AB 4-0 PS2 18 (SUTURE) ×8 IMPLANT
SUT VICRYL 0 27 CT2 27 ABS (SUTURE) ×4 IMPLANT
SUT VLOC 180 0 9IN GS21 (SUTURE) ×2 IMPLANT
SYR 10ML LL (SYRINGE) IMPLANT
SYSTEM BAG RETRIEVAL 10MM (BASKET) IMPLANT
SYSTEM WOUND ALEXIS 18CM MED (MISCELLANEOUS) IMPLANT
TRAP SPECIMEN MUCUS 40CC (MISCELLANEOUS) IMPLANT
TRAY FOLEY MTR SLVR 16FR STAT (SET/KITS/TRAYS/PACK) ×4 IMPLANT
TROCAR PORT AIRSEAL 5X120 (TROCAR) IMPLANT
UNDERPAD 30X36 HEAVY ABSORB (UNDERPADS AND DIAPERS) ×8 IMPLANT
WATER STERILE IRR 1000ML POUR (IV SOLUTION) ×4 IMPLANT
YANKAUER SUCT BULB TIP 10FT TU (MISCELLANEOUS) IMPLANT

## 2023-09-15 NOTE — Interval H&P Note (Signed)
 History and Physical Interval Note:  09/15/2023 10:29 AM  Karina Aguirre Ill  has presented today for surgery, with the diagnosis of ENDOMETRIACAL CANCER.  The various methods of treatment have been discussed with the patient and family. After consideration of risks, benefits and other options for treatment, the patient has consented to  Procedure(s): HYSTERECTOMY, TOTAL, ROBOT-ASSISTED, LAPAROSCOPIC, WITH BILATERAL SALPINGO-OOPHORECTOMY (Bilateral) INJECTION, FOR SENTINEL LYMPH NODE IDENTIFICATION (N/A) LYMPH NODE BIOPSY (N/A) as a surgical intervention.  The patient's history has been reviewed, patient examined, no change in status, stable for surgery.  I have reviewed the patient's chart and labs.  Questions were answered to the patient's satisfaction.     [name]

## 2023-09-15 NOTE — Transfer of Care (Signed)
 Immediate Anesthesia Transfer of Care Note  Patient: Karina Aguirre  Procedure(s) Performed: HYSTERECTOMY, TOTAL, ROBOT-ASSISTED, LAPAROSCOPIC, WITH BILATERAL SALPINGO-OOPHORECTOMY (Bilateral: Abdomen) INJECTION, FOR SENTINEL LYMPH NODE IDENTIFICATION (Abdomen)  Patient Location: PACU  Anesthesia Type:General  Level of Consciousness: sedated  Airway & Oxygen Therapy: Patient Spontanous Breathing and Patient connected to face mask oxygen  Post-op Assessment: Report given to RN and Post -op Vital signs reviewed and stable  Post vital signs: Reviewed and stable  Last Vitals:  Vitals Value Taken Time  BP 135/72 09/15/23 13:32  Temp    Pulse 60 09/15/23 13:36  Resp 17 09/15/23 13:36  SpO2 100 % 09/15/23 13:36  Vitals shown include unfiled device data.  Last Pain:  Vitals:   09/15/23 0915  TempSrc:   PainSc: 0-No pain         Complications: No notable events documented.

## 2023-09-15 NOTE — Anesthesia Preprocedure Evaluation (Signed)
 Anesthesia Evaluation  Patient identified by MRN, date of birth, ID band Patient awake    Reviewed: Allergy & Precautions, NPO status , Patient's Chart, lab work & pertinent test results  Airway Mallampati: I  TM Distance: >3 FB Neck ROM: Full    Dental  (+) Teeth Intact, Dental Advisory Given   Pulmonary neg pulmonary ROS   breath sounds clear to auscultation       Cardiovascular negative cardio ROS  Rhythm:Regular Rate:Normal     Neuro/Psych Seizures -,   Neuromuscular disease    GI/Hepatic Neg liver ROS,GERD  ,,  Endo/Other  negative endocrine ROS    Renal/GU negative Renal ROS     Musculoskeletal  (+) Arthritis ,    Abdominal   Peds  Hematology negative hematology ROS (+)   Anesthesia Other Findings   Reproductive/Obstetrics                              Anesthesia Physical Anesthesia Plan  ASA: 2  Anesthesia Plan: General   Post-op Pain Management: Tylenol  PO (pre-op)* and Toradol  IV (intra-op)*   Induction: Intravenous  PONV Risk Score and Plan: 4 or greater and Ondansetron , Dexamethasone , Midazolam  and Scopolamine  patch - Pre-op  Airway Management Planned: Oral ETT  Additional Equipment: None  Intra-op Plan:   Post-operative Plan: Extubation in OR  Informed Consent: I have reviewed the patients History and Physical, chart, labs and discussed the procedure including the risks, benefits and alternatives for the proposed anesthesia with the patient or authorized representative who has indicated his/her understanding and acceptance.     Dental advisory given  Plan Discussed with: CRNA  Anesthesia Plan Comments:         Anesthesia Quick Evaluation

## 2023-09-15 NOTE — Anesthesia Postprocedure Evaluation (Signed)
 Anesthesia Post Note  Patient: Karina Aguirre  Procedure(s) Performed: HYSTERECTOMY, TOTAL, ROBOT-ASSISTED, LAPAROSCOPIC, WITH BILATERAL SALPINGO-OOPHORECTOMY (Bilateral: Abdomen) INJECTION, FOR SENTINEL LYMPH NODE IDENTIFICATION (Abdomen)     Patient location during evaluation: PACU Anesthesia Type: General Level of consciousness: awake and alert Pain management: pain level controlled Vital Signs Assessment: post-procedure vital signs reviewed and stable Respiratory status: spontaneous breathing, nonlabored ventilation, respiratory function stable and patient connected to nasal cannula oxygen Cardiovascular status: blood pressure returned to baseline and stable Postop Assessment: no apparent nausea or vomiting Anesthetic complications: no   No notable events documented.  Last Vitals:  Vitals:   09/15/23 1545 09/15/23 1600  BP: 129/68 124/75  Pulse: 64 68  Resp:    Temp:    SpO2: 98% 93%    Last Pain:  Vitals:   09/15/23 1600  TempSrc:   PainSc: Asleep                 [name]

## 2023-09-15 NOTE — Op Note (Signed)
 OPERATIVE NOTE  Pre-operative Diagnosis: endometrial cancer grade 1  Post-operative Diagnosis: same  Operation: Robotic-assisted laparoscopic total hysterectomy with bilateral salpingoophorectomy, SLN biopsy bilaterally  Surgeon: Viktoria Crank MD  Assistant Surgeon: Eleanor Epps, NP (an NP assistant was necessary for tissue manipulation, management of robotic instrumentation, retraction and positioning due to the complexity of the case and hospital policies).   Anesthesia: GET  Urine Output: 150 cc  Operative Findings:On EUA, small mobile uterus. On intra-abdominal entry, normal upper abdominal survey. Normal omentum, small and large bowel, appendix. Uterus 6 cm and normal in appearance. Atrophic appearing bilateral adnexa. Mapping successful to bilateral external iliac SLNs, left obturator SLN. No ascites. Some adhesions of the sigmoid colon to the left sidewall and left pelvic brim, sigmoid diverticulosis.    Estimated Blood Loss:  50 cc      Total IV Fluids: see I&O flowsheet         Specimens: uterus, cervix, bilateral tubes and ovaries, bilateral external iliac SLNs, left obturator SLN         Complications:  None apparent; patient tolerated the procedure well.         Disposition: PACU - hemodynamically stable.  Procedure Details  The patient was seen in the Holding Room. The risks, benefits, complications, treatment options, and expected outcomes were discussed with the patient.  The patient concurred with the proposed plan, giving informed consent.  The site of surgery properly noted/marked. The patient was identified as Karina Aguirre and the procedure verified as a Robotic-assisted hysterectomy with bilateral salpingo oophorectomy with SLN biopsy.   After induction of anesthesia, the patient was draped and prepped in the usual sterile manner. Patient was placed in supine position after anesthesia and draped and prepped in the usual sterile manner as follows: Her arms were  tucked to her side with all appropriate precautions.  The patient was secured to the bed using padding and tape across her chest.  The patient was placed in the semi-lithotomy position in [place] stirrups.  The perineum and vagina were prepped with CHG. The patient's abdomen was prepped with ChloraPrep and she was draped after the prep had been allowed to dry for 3 minutes.  A Time Out was held and the above information confirmed.  The urethra was prepped with Betadine. Foley catheter was placed.  A sterile speculum was placed in the vagina.  The cervix was grasped with a single-tooth tenaculum. 2mg  total of ICG was injected into the cervical stroma at 2 and 9 o'clock with 1cc injected at a 1cm and 2mm depth (concentration 0.5mg /ml) in all locations. The cervix was dilated with Fredirick dilators.  The ZUMI uterine manipulator with a small colpotomizer ring was placed without difficulty.  A pneumo-occluder balloon was placed over the manipulator.  OG tube placement was confirmed and to suction.   Next, a 10 mm skin incision was made 1 cm below the subcostal margin in the midclavicular line.  The 5 mm Optiview port and scope was used for direct entry.  Opening pressure was under 10 mm CO2.  The abdomen was insufflated and the findings were noted as above.   At this point and all points during the procedure, the patient's intra-abdominal pressure did not exceed 15 mmHg.There was no clear evidence that mesh had been used at the time of her hernia repair.   Next, an 8 mm skin incision was made superior to the umbilicus and a right and left port were placed about 8 cm lateral to the robot  port on the right and left side.  A fourth arm was placed on the right.  The 5 mm assist trocar was exchanged for a 12 mm airseal port. All ports were placed under direct visualization.  The patient was placed in steep Trendelenburg.  Bowel was folded away into the upper abdomen.  The robot was docked in the normal manner.  The right  and left peritoneum were opened parallel to the IP ligament to open the retroperitoneal spaces bilaterally. The round ligaments were transected. The SLN mapping was performed in bilateral pelvic basins. After identifying the ureters, the para rectal and paravesical spaces were opened up entirely with careful dissection below the level of the ureters bilaterally and to the depth of the uterine artery origin in order to skeletonize the uterine web and ensure visualization of all parametrial channels. The para-aortic basins were carefully exposed and evaluated for isolated para-aortic SLN's. Lymphatic channels were identified travelling to the following visualized sentinel lymph node's: bilateral external iliac SLNs, left obturator SLN. These SLN's were separated from their surrounding lymphatic tissue, removed and sent for permanent pathology.  On the left, the colon was mobilized from the left sidewall and pelvic brim using sharp dissection and short bursts of monopolar electrocautery.  The hysterectomy was started.  The ureter was again noted to be on the medial leaf of the broad ligament.  The peritoneum above the ureter was incised and stretched and the infundibulopelvic ligament was skeletonized, cauterized and cut.  The posterior peritoneum was taken down to the level of the KOH ring.  The anterior peritoneum was also taken down.  The bladder flap was created to the level of the KOH ring.  The uterine artery on the right side was skeletonized, cauterized and cut in the normal manner.  A similar procedure was performed on the left.  The colpotomy was made and the uterus, cervix, bilateral ovaries and tubes were amputated and delivered through the vagina.  Pedicles were inspected and excellent hemostasis was achieved.    The colpotomy at the vaginal cuff was closed with 0 Vicryl on a CT1 with a figure of eight at each apex and 0 V-Lock to close the midportion of the cuff in two layers in a running manner.   Irrigation was used and excellent hemostasis was achieved.  At this point in the procedure was completed.  Robotic instruments were removed under direct visulaization.  The robot was undocked. The fascia at the 12 mm port was closed with 0 Vicryl  using a PMI facial closure device under direct visualization.  The subcuticular tissue was closed with 4-0 Vicryl and the skin was closed with 4-0 Monocryl in a subcuticular manner.  Dermabond was applied.    The vagina was swabbed with  minimal bleeding noted. Foley catheter was removed.  All sponge, lap and needle counts were correct x  3.   The patient was transferred to the recovery room in stable condition.  Comer Dollar, MD

## 2023-09-15 NOTE — Discharge Instructions (Addendum)
 AFTER SURGERY INSTRUCTIONS   Return to work: 4-6 weeks if applicable   Activity: 1. Be up and out of the bed during the day.  Take a nap if needed.  You may walk up steps but be careful and use the hand rail.  Stair climbing will tire you more than you think, you may need to stop part way and rest.    2. No lifting or straining for 6 weeks over 10 pounds. No pushing, pulling, straining for 6 weeks.   3. No driving for 1-61 days when the following criteria have been met: Do not drive if you are taking narcotic pain medicine and make sure that your reaction time has returned.    4. You can shower as soon as the next day after surgery. Shower daily.  Use your regular soap and water  (not directly on the incision) and pat your incision(s) dry afterwards; don't rub.  No tub baths or submerging your body in water  until cleared by your surgeon. If you have the soap that was given to you by pre-surgical testing that was used before surgery, you do not need to use it afterwards because this can irritate your incisions.    5. No sexual activity and nothing in the vagina for 12 weeks.   6. You may experience a small amount of clear drainage from your incisions, which is normal.  If the drainage persists, increases, or changes color please call the office.   7. Do not use creams, lotions, or ointments such as neosporin on your incisions after surgery until advised by your surgeon because they can cause removal of the dermabond glue on your incisions.     8. You may experience vaginal spotting after surgery or when the stitches at the top of the vagina begin to dissolve.  The spotting is normal but if you experience heavy bleeding, call our office.   9. Take Tylenol or ibuprofen first for pain if you are able to take these medications and only use narcotic pain medication for severe pain not relieved by the Tylenol or Ibuprofen.  Monitor your Tylenol intake to a max of 4,000 mg in a 24 hour period. You can  alternate these medications after surgery.   Diet: 1. Low sodium Heart Healthy Diet is recommended but you are cleared to resume your normal (before surgery) diet after your procedure.   2. It is safe to use a laxative, such as Miralax or Colace, if you have difficulty moving your bowels before surgery. You have been prescribed Sennakot-S to take at bedtime every evening after surgery to keep bowel movements regular and to prevent constipation.     Wound Care: 1. Keep clean and dry.  Shower daily.   Reasons to call the Doctor: Fever - Oral temperature greater than 100.4 degrees Fahrenheit Foul-smelling vaginal discharge Difficulty urinating Nausea and vomiting Increased pain at the site of the incision that is unrelieved with pain medicine. Difficulty breathing with or without chest pain New calf pain especially if only on one side Sudden, continuing increased vaginal bleeding with or without clots.   Contacts: For questions or concerns you should contact:   Dr. Wiley Hanger at [phone]   Karina Grieves, NP at [phone]   After Hours: call [phone] and have the GYN Oncologist paged/contacted (after 5 pm or on the weekends). You will speak with an after hours RN and let he or she know you have had surgery.   Messages sent via mychart are for non-urgent  matters and are not responded to after hours so for urgent needs, please call the after hours number.

## 2023-09-15 NOTE — Anesthesia Procedure Notes (Signed)
 Procedure Name: Intubation Date/Time: 09/15/2023 11:31 AM  Performed by: Lanning Cena RAMAN, CRNAPre-anesthesia Checklist: Patient identified, Emergency Drugs available, Suction available, Patient being monitored and Timeout performed Patient Re-evaluated:Patient Re-evaluated prior to induction Oxygen Delivery Method: Circle system utilized Preoxygenation: Pre-oxygenation with 100% oxygen Induction Type: IV induction Ventilation: Mask ventilation without difficulty Laryngoscope Size: Miller and 2 Grade View: Grade II Tube type: Oral Tube size: 7.0 mm Number of attempts: 1 Airway Equipment and Method: Stylet Placement Confirmation: ETT inserted through vocal cords under direct vision, positive ETCO2, breath sounds checked- equal and bilateral and CO2 detector Secured at: 19 cm Tube secured with: Tape Dental Injury: Teeth and Oropharynx as per pre-operative assessment

## 2023-09-16 ENCOUNTER — Encounter (HOSPITAL_COMMUNITY): Payer: Self-pay | Admitting: Gynecologic Oncology

## 2023-09-16 ENCOUNTER — Telehealth: Payer: Self-pay | Admitting: *Deleted

## 2023-09-16 NOTE — Telephone Encounter (Signed)
 Spoke with Ms. Truex this morning. She states she is eating, drinking and urinating well. She has not had a BM yet and is not passing gas yet. She is taking senokot as prescribed and encouraged her to drink plenty of water . Encouraged ambulation for the gas  as well as hot liquids and over the counter gas-x if needed. She denies fever or chills. Incisions are dry and intact. She rates her pain 6/10. Her pain is controlled with ibuprofen and tramadol .     Instructed to call office with any fever, chills, purulent drainage, uncontrolled pain or any other questions or concerns. Patient verbalizes understanding.   Pt aware of post op appointments as well as the office number [phone] and after hours number [phone] to call if she has any questions or concerns

## 2023-09-19 LAB — SURGICAL PATHOLOGY

## 2023-09-22 ENCOUNTER — Inpatient Hospital Stay (HOSPITAL_BASED_OUTPATIENT_CLINIC_OR_DEPARTMENT_OTHER): Admitting: Gynecologic Oncology

## 2023-09-22 ENCOUNTER — Encounter: Payer: Self-pay | Admitting: Gynecologic Oncology

## 2023-09-22 DIAGNOSIS — C541 Malignant neoplasm of endometrium: Secondary | ICD-10-CM

## 2023-09-22 DIAGNOSIS — Z7189 Other specified counseling: Secondary | ICD-10-CM

## 2023-09-22 DIAGNOSIS — Z9079 Acquired absence of other genital organ(s): Secondary | ICD-10-CM

## 2023-09-22 DIAGNOSIS — Z90722 Acquired absence of ovaries, bilateral: Secondary | ICD-10-CM

## 2023-09-22 NOTE — Progress Notes (Signed)
 Gynecologic Oncology Telehealth Note: Gyn-Onc  I connected with Nathanel GORMAN Ill on 09/22/23 at  6:00 PM EDT by telephone and verified that I am speaking with the correct person using two identifiers.  I discussed the limitations, risks, security and privacy concerns of performing an evaluation and management service by telemedicine and the availability of in-person appointments. I also discussed with the patient that there may be a patient responsible charge related to this service. The patient expressed understanding and agreed to proceed.  Other persons participating in the visit and their role in the encounter: none.  Patient's location: home, [place] Provider's location: [facility], [place]  Reason for Visit: follow-up  Treatment History: Patient initially presented with postmenopausal bleeding.  Pelvic ultrasound performed on 5/22, small amount of fluid seen within the endometrial cavity and cervical canal.  Uterus measured 4.5 x 3.6 x 1.8 cm with an endometrium of 10.5 mm.  Endometrium thickened with what appeared to be a 25 mm density within the endometrial cavity and 7 mm density within the cervical canal.  No adnexal masses seen.  No free fluid.   On 6/23, the patient underwent hysteroscopy with endometrial sampling.  Findings included endometrium with friable tissue, possibly polypoid at the junction of the lower uterine segment and internal cervical os.  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  Endometrial curettage specimen showed minute fragments of atypical glandular epithelium consistent with adenocarcinoma.  09/15/23: Robotic-assisted laparoscopic total hysterectomy with bilateral salpingoophorectomy, SLN biopsy bilaterally   Interval History: Doing well, still sore. Bowels moving well. Denies vaginal bleeding. Urinating without issues.  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    Past Surgical History:  Procedure Laterality Date   BREAST SURGERY Left    x2   ELBOW SURGERY Left    INJECTION, FOR SENTINEL LYMPH NODE IDENTIFICATION N/A 09/15/2023   Procedure: INJECTION, FOR SENTINEL LYMPH NODE IDENTIFICATION;  Surgeon: Viktoria Comer SAUNDERS, MD;  Location: [facility];  Service: Gynecology;  Laterality: N/A;   NASAL SINUS SURGERY  1981   x2   ROBOTIC ASSISTED TOTAL HYSTERECTOMY WITH BILATERAL SALPINGO OOPHERECTOMY Bilateral 09/15/2023   Procedure: HYSTERECTOMY, TOTAL, ROBOT-ASSISTED, LAPAROSCOPIC, WITH BILATERAL SALPINGO-OOPHORECTOMY;  Surgeon: Viktoria Comer SAUNDERS, MD;  Location: [facility];  Service: Gynecology;  Laterality: Bilateral;   ROTATOR CUFF REPAIR Left    UMBILICAL HERNIA REPAIR  1981   unsure re mesh    Family History  Problem Relation Age of Onset   Breast cancer Mother 56   Heart murmur Mother    Heart disease Mother    Hypertension Father    Diabetes Father    Hypertension Sister    Hyperlipidemia Sister    Hypertension Brother    Hyperlipidemia Brother    Asthma Maternal Grandmother    Bone cancer Paternal Grandmother    Prostate cancer Neg Hx    Endometrial cancer Neg Hx    Ovarian cancer Neg Hx    Colon cancer Neg Hx    Pancreatic cancer Neg Hx     Social History   Socioeconomic History   Marital status: Single    Spouse name: Not on file   Number of children:  0   Years of education: Not on file   Highest education level: Not on file  Occupational History   Occupation: supervisor  Tobacco Use   Smoking status: Never   Smokeless tobacco: Never  Vaping Use   Vaping status: Never Used  Substance and Sexual Activity   Alcohol use: No   Drug use: No   Sexual activity: Yes  Other Topics Concern   Not on file  Social  History Narrative   Not on file   Social Drivers of Health   Financial Resource Strain: Not on file  Food Insecurity: No Food Insecurity (09/12/2023)   Hunger Vital Sign    Worried About Running Out of Food in the Last Year: Never true    Ran Out of Food in the Last Year: Never true  Transportation Needs: No Transportation Needs (09/12/2023)   PRAPARE - Administrator, Civil Service (Medical): No    Lack of Transportation (Non-Medical): No  Physical Activity: Not on file  Stress: Not on file  Social Connections: Unknown (12/09/2021)   Received from [facility]   Social Network    Social Network: Not on file    Current Medications:  Current Outpatient Medications:    Calcium Carb-Cholecalciferol (CALCIUM 600/VITAMIN D PO), Take 1 capsule by mouth in the morning., Disp: , Rfl:    Cholecalciferol (VITAMIN D PO), Take 2,000 Units by mouth in the morning., Disp: , Rfl:    clobetasol cream (TEMOVATE) 0.05 %, Apply 1 Application topically 2 (two) times daily as needed (skin irritation.)., Disp: , Rfl:    levocetirizine (XYZAL) 5 MG tablet, Take 5 mg by mouth in the morning., Disp: , Rfl:    MAGNESIUM PO, Take 1 capsule by mouth in the morning., Disp: , Rfl:    omeprazole  (PRILOSEC) 40 MG capsule, 40 mg every other day. In the morning., Disp: , Rfl:    ondansetron  (ZOFRAN -ODT) 4 MG disintegrating tablet, Take 1 tablet (4 mg total) by mouth every 8 (eight) hours as needed for nausea or vomiting., Disp: 12 tablet, Rfl: 0   senna-docusate (SENOKOT-S) 8.6-50 MG tablet, Take 2 tablets by mouth at bedtime. For AFTER surgery, do not take if having diarrhea, Disp: 30 tablet, Rfl: 0   traMADol  (ULTRAM ) 50 MG tablet, Take 1 tablet (50 mg total) by mouth every 6 (six) hours as needed for severe pain (pain score 7-10). For AFTER surgery only, do not take and drive, Disp: 10 tablet, Rfl: 0  Review of Symptoms: Pertinent positives as per HPI.  Physical Exam: Deferred given limitations of  phone visit.  Laboratory & Radiologic Studies: A. SENTINEL LYMPH NODE, RIGHT EXTERNAL ILIAC, EXCISION: One lymph node negative for metastatic carcinoma (0/1).  B. SENTINEL LYMPH NODE, LEFT OBTURATOR, EXCISION: One lymph node negative for metastatic carcinoma (0/1).  C. SENTINEL LYMPH NODE, LEFT EXTERNAL ILIAC, EXCISION: One lymph node negative for metastatic carcinoma (0/1).  D. UTERUS INCLUDING CERVIX, BILATERAL FALLOPIAN TUBES AND OVARIES, RESECTION: Endometrium     Endometrioid adenocarcinoma with foci of squamous differentiation, FIGO grade 1     Negative for myometrial invasion (pT1a)     See oncology table Cervix     Nabothian cysts, negative for carcinoma Bilateral ovaries     Unremarkable, negative for carcinoma Bilateral fallopian tubes     Unremarkable, negative for carcinoma  ONCOLOGY TABLE: UTERUS, CARCINOMA OR CARCINOSARCOMA: Resection Procedure: Total hysterectomy with bilateral salpingo-oophorectomy and sentinel lymph node biopsies Histologic Type: Endometrioid with focal squamous differentiation Histologic Grade:  FIGO grade 1 Myometrial Invasion:      Depth of Myometrial Invasion (mm): 0 mm      Myometrial Thickness (mm): 10 mm      Percentage of Myometrial Invasion: 0% Uterine Serosa Involvement: Not identified Cervical stromal Involvement: Not identified Extent of involvement of other tissue/organs: Not applicable Peritoneal/Ascitic Fluid: Not applicable Lymphovascular Invasion: Not identified Regional Lymph Nodes:      Pelvic Lymph Nodes Examined:          3 Sentinel          0 non-sentinel          3 total      Pelvic Lymph Nodes with Metastasis: 0          Macrometastasis: (>2.0 mm): 0          Micrometastasis: (>0.2 mm and < 2.0 mm): 0          Isolated Tumor Cells (<0.2 mm): 0 Distant Metastasis:      Distant Site(s) Involved: Not applicable Pathologic Stage Classification (pTNM, AJCC 8th Edition): pT1a, pN0 Ancillary Studies: Can be  performed if requested Representative Tumor Block: D1 Comment(s): Immunohistochemistry for cytokeratin AE1/AE3 performed on the sentinel lymph nodes (parts A, B, C) is negative for metastatic carcinoma. (v4.2.0.1)   Assessment & Plan: Karina Aguirre is a 63 y.o. 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  Doing well, meeting postoperative milestones.  Discussed continued expectations and restrictions.  Reviewed pathology with her from surgery.  She is very happy with this news.  I discussed the assessment and treatment plan with the patient. The patient was provided with an opportunity to ask questions and all were answered. The patient agreed with the plan and demonstrated an understanding of the instructions.   The patient was advised to call back or see an in-person evaluation if the symptoms worsen or if the condition fails to improve as anticipated.   8 minutes of total time was spent for this patient encounter, including preparation, phone counseling with the patient and coordination of care, and documentation of the encounter.   Comer Dollar, MD  Division of Gynecologic Oncology  Department of Obstetrics and Gynecology  [facility]

## 2023-09-23 ENCOUNTER — Inpatient Hospital Stay: Admitting: Licensed Clinical Social Worker

## 2023-09-23 NOTE — Progress Notes (Signed)
 [facility] CSW Progress Note  Clinical Child psychotherapist contacted patient by phone to follow-up on emotional support.    Interventions: Provided brief mental health counseling with regard to adjustment to medical concerns    Pt reports doing well with coping post-surgery. She is especially relieved after learning about the pathology and no need for further treatment.  Pt is taking it easy in recovery and has continued strong support from her boyfriend. She plans to return to work after Labor Day and focus on herself until then.     Follow Up Plan:  Patient will contact CSW with any support or resource needs    Deatrice Spanbauer E Giovanie Lefebre, LCSW Clinical Social Worker [facility]

## 2023-09-27 ENCOUNTER — Telehealth: Payer: Self-pay | Admitting: *Deleted

## 2023-09-27 NOTE — Telephone Encounter (Signed)
 Spoke with Karina Aguirre who states she starting having loose watery stools this morning and is experiencing nausea with no vomiting. Pt reports keeping herself well hydrated, denies fever, chills, bleeding and no urinary symptoms. Pt also states she is having pain just below the left upper abdominal incision near breast bone. Reports the pain was 8/10 yesterday, but today it's better at 4/10. She denies taking anything for the pain. Pt is still taking senokot-s 2 in the evening.   Advised Karina Aguirre to stop taking the senokot-s, alternate heat and ice on the upper abdominal area of discomfort -their is a deep suture that takes weeks to heal and follow all activity restrictions. Pt should also stop taking her magnesium until her loose stools have resolved. Also advised patient to take a zofran  from her med list for the nausea and the office would call tomorrow to check in. Pt verbalized understanding and thanked the office for calling.

## 2023-09-28 NOTE — Telephone Encounter (Signed)
 Spoke with Ms. Ranker who states she is feeling much better. She has had no more diarrhea and no more nausea and has able to eat her meals today without difficulty. Advised patient that she can start taking her oral magnesium supplement tomorrow and add the senokot back in if needed. Pt verbalized understanding and thanked the office for calling.

## 2023-10-14 ENCOUNTER — Inpatient Hospital Stay: Attending: Gynecologic Oncology | Admitting: Gynecologic Oncology

## 2023-10-14 ENCOUNTER — Encounter: Payer: Self-pay | Admitting: Gynecologic Oncology

## 2023-10-14 VITALS — BP 111/74 | HR 70 | Temp 98.1°F | Resp 19 | Wt 159.2 lb

## 2023-10-14 DIAGNOSIS — K5900 Constipation, unspecified: Secondary | ICD-10-CM

## 2023-10-14 DIAGNOSIS — Z7189 Other specified counseling: Secondary | ICD-10-CM

## 2023-10-14 DIAGNOSIS — C541 Malignant neoplasm of endometrium: Secondary | ICD-10-CM

## 2023-10-14 NOTE — Patient Instructions (Signed)
 It was great to see you.  You are healing very well from surgery!  Please remember, no heavy lifting for 6 weeks after surgery and nothing in the vagina for 12 weeks.  I will see you back for your first visit in 6 months.  A little bit of vaginal spotting over the next several months would be normal as your stitches dissolved at the top of the vagina.  If you develop any of the symptoms that we discussed today including bleeding, change to bowel or bladder function, abdominal or pelvic pain, unintentional weight loss, please call to see me sooner.

## 2023-10-14 NOTE — Progress Notes (Signed)
 Gynecologic Oncology Return Clinic Visit  10/14/23  Reason for Visit: treatment planning  Treatment History: Patient initially presented with postmenopausal bleeding.  Pelvic ultrasound performed on 5/22, small amount of fluid seen within the endometrial cavity and cervical canal.  Uterus measured 4.5 x 3.6 x 1.8 cm with an endometrium of 10.5 mm.  Endometrium thickened with what appeared to be a 25 mm density within the endometrial cavity and 7 mm density within the cervical canal.  No adnexal masses seen.  No free fluid.   On 6/23, the patient underwent hysteroscopy with endometrial sampling.  Findings included endometrium with friable tissue, possibly polypoid at the junction of the lower uterine segment and internal cervical os.  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  Endometrial curettage specimen showed minute fragments of atypical glandular epithelium consistent with adenocarcinoma.   09/15/23: Robotic-assisted laparoscopic total hysterectomy with bilateral salpingoophorectomy, SLN biopsy bilaterally  Interval History: Doing very well denies any vaginal bleeding.  Still taking Senokot for bowel function, has struggled with some constipation.  Denies any urinary symptoms.  Denies any abdominal pain.  Past Medical/Surgical History: Past Medical History:  Diagnosis Date   Arthritis    Diverticulitis    Endometrial cancer (HCC)    GERD (gastroesophageal reflux disease)    Osteopenia    Pancreatitis    1981   Seizure (HCC)    last seizure in 17s (only two), took medication for long time to prevent    Past Surgical History:  Procedure Laterality Date   BREAST SURGERY Left    x2   ELBOW SURGERY Left    INJECTION, FOR SENTINEL LYMPH NODE IDENTIFICATION N/A 09/15/2023   Procedure: INJECTION, FOR SENTINEL LYMPH NODE IDENTIFICATION;  Surgeon: Viktoria Comer SAUNDERS, MD;  Location: [facility];  Service: Gynecology;  Laterality:  N/A;   NASAL SINUS SURGERY  1981   x2   ROBOTIC ASSISTED TOTAL HYSTERECTOMY WITH BILATERAL SALPINGO OOPHERECTOMY Bilateral 09/15/2023   Procedure: HYSTERECTOMY, TOTAL, ROBOT-ASSISTED, LAPAROSCOPIC, WITH BILATERAL SALPINGO-OOPHORECTOMY;  Surgeon: Viktoria Comer SAUNDERS, MD;  Location: [facility];  Service: Gynecology;  Laterality: Bilateral;   ROTATOR CUFF REPAIR Left    UMBILICAL HERNIA REPAIR  1981   unsure re mesh    Family History  Problem Relation Age of Onset   Breast cancer Mother 10   Heart murmur Mother    Heart disease Mother    Hypertension Father    Diabetes Father    Hypertension Sister    Hyperlipidemia Sister    Hypertension Brother    Hyperlipidemia Brother    Asthma Maternal Grandmother    Bone cancer Paternal Grandmother    Prostate cancer Neg Hx    Endometrial cancer Neg Hx    Ovarian cancer Neg Hx    Colon cancer Neg Hx    Pancreatic cancer Neg Hx     Social History   Socioeconomic History   Marital status: Single    Spouse name: Not on file   Number of children: 0   Years of education: Not on file   Highest education level: Not on file  Occupational History   Occupation: Merchandiser, retail  Tobacco Use   Smoking status: Never   Smokeless tobacco: Never  Vaping Use   Vaping status: Never Used  Substance and Sexual Activity   Alcohol use: No   Drug use: No   Sexual activity: Yes  Other Topics Concern   Not on file  Social History Narrative   Not on file  Social Drivers of Corporate investment banker Strain: Not on file  Food Insecurity: No Food Insecurity (09/12/2023)   Hunger Vital Sign    Worried About Running Out of Food in the Last Year: Never true    Ran Out of Food in the Last Year: Never true  Transportation Needs: No Transportation Needs (09/12/2023)   PRAPARE - Administrator, Civil Service (Medical): No    Lack of Transportation (Non-Medical): No  Physical Activity: Not on file  Stress: Not on file  Social Connections: Unknown  (12/09/2021)   Received from [facility]   Social Network    Social Network: Not on file    Current Medications:  Current Outpatient Medications:    Calcium Carb-Cholecalciferol (CALCIUM 600/VITAMIN D PO), Take 1 capsule by mouth in the morning., Disp: , Rfl:    Cholecalciferol (VITAMIN D PO), Take 2,000 Units by mouth in the morning., Disp: , Rfl:    clobetasol cream (TEMOVATE) 0.05 %, Apply 1 Application topically 2 (two) times daily as needed (skin irritation.)., Disp: , Rfl:    levocetirizine (XYZAL) 5 MG tablet, Take 5 mg by mouth in the morning., Disp: , Rfl:    MAGNESIUM PO, Take 1 capsule by mouth in the morning., Disp: , Rfl:    omeprazole  (PRILOSEC) 40 MG capsule, 40 mg every other day. In the morning., Disp: , Rfl:    ondansetron  (ZOFRAN -ODT) 4 MG disintegrating tablet, Take 1 tablet (4 mg total) by mouth every 8 (eight) hours as needed for nausea or vomiting., Disp: 12 tablet, Rfl: 0   senna-docusate (SENOKOT-S) 8.6-50 MG tablet, Take 2 tablets by mouth at bedtime. For AFTER surgery, do not take if having diarrhea, Disp: 30 tablet, Rfl: 0   traMADol  (ULTRAM ) 50 MG tablet, Take 1 tablet (50 mg total) by mouth every 6 (six) hours as needed for severe pain (pain score 7-10). For AFTER surgery only, do not take and drive, Disp: 10 tablet, Rfl: 0  Review of Systems: + back pain Denies appetite changes, fevers, chills, fatigue, unexplained weight changes. Denies hearing loss, neck lumps or masses, mouth sores, ringing in ears or voice changes. Denies cough or wheezing.  Denies shortness of breath. Denies chest pain or palpitations. Denies leg swelling. Denies abdominal distention, pain, blood in stools, diarrhea, nausea, vomiting, or early satiety. Denies pain with intercourse, dysuria, frequency, hematuria or incontinence. Denies hot flashes, pelvic pain, vaginal bleeding or vaginal discharge.   Denies joint pain or muscle pain/cramps. Denies itching, rash, or wounds. Denies  dizziness, headaches, numbness or seizures. Denies swollen lymph nodes or glands, denies easy bruising or bleeding. Denies anxiety, depression, confusion, or decreased concentration.  Physical Exam: BP 111/74 (BP Location: Right Arm, Patient Position: Sitting)   Pulse 70   Temp 98.1 F (36.7 C) (Oral)   Resp 19   Wt 159 lb 3.2 oz (72.2 kg)   SpO2 99%   BMI 30.08 kg/m  General: Alert, oriented, no acute distress. HEENT: Posterior oropharynx clear, sclera anicteric. Chest: Unlabored breathing on room air. Abdomen: soft, nontender.  Normoactive bowel sounds.  No masses or hepatosplenomegaly appreciated.  Well-healed incisions. Extremities: Grossly normal range of motion.  Warm, well perfused.  No edema bilaterally. GU: Normal appearing external genitalia without erythema, excoriation, or lesions.  Speculum exam reveals cuff intact, suture visible.  Bimanual exam reveals cuff intact, no fluctuance or tenderness to palpation.    Laboratory & Radiologic Studies: A. SENTINEL LYMPH NODE, RIGHT EXTERNAL ILIAC, EXCISION: One lymph  node negative for metastatic carcinoma (0/1).  B. SENTINEL LYMPH NODE, LEFT OBTURATOR, EXCISION: One lymph node negative for metastatic carcinoma (0/1).  C. SENTINEL LYMPH NODE, LEFT EXTERNAL ILIAC, EXCISION: One lymph node negative for metastatic carcinoma (0/1).  D. UTERUS INCLUDING CERVIX, BILATERAL FALLOPIAN TUBES AND OVARIES, RESECTION: Endometrium     Endometrioid adenocarcinoma with foci of squamous differentiation, FIGO grade 1     Negative for myometrial invasion (pT1a)     See oncology table Cervix     Nabothian cysts, negative for carcinoma Bilateral ovaries     Unremarkable, negative for carcinoma Bilateral fallopian tubes     Unremarkable, negative for carcinoma  ONCOLOGY TABLE: UTERUS, CARCINOMA OR CARCINOSARCOMA: Resection Procedure: Total hysterectomy with bilateral salpingo-oophorectomy and sentinel lymph node biopsies Histologic  Type: Endometrioid with focal squamous differentiation Histologic Grade: FIGO grade 1 Myometrial Invasion:      Depth of Myometrial Invasion (mm): 0 mm      Myometrial Thickness (mm): 10 mm      Percentage of Myometrial Invasion: 0% Uterine Serosa Involvement: Not identified Cervical stromal Involvement: Not identified Extent of involvement of other tissue/organs: Not applicable Peritoneal/Ascitic Fluid: Not applicable Lymphovascular Invasion: Not identified Regional Lymph Nodes:      Pelvic Lymph Nodes Examined:          3 Sentinel          0 non-sentinel          3 total      Pelvic Lymph Nodes with Metastasis: 0          Macrometastasis: (>2.0 mm): 0          Micrometastasis: (>0.2 mm and < 2.0 mm): 0          Isolated Tumor Cells (<0.2 mm): 0 Distant Metastasis:      Distant Site(s) Involved: Not applicable Pathologic Stage Classification (pTNM, AJCC 8th Edition): pT1a, pN0 Ancillary Studies: Can be performed if requested Representative Tumor Block: D1 Comment(s): Immunohistochemistry for cytokeratin AE1/AE3 performed on the sentinel lymph nodes (parts A, B, C) is negative for metastatic carcinoma. (v4.2.0.1)   Assessment & Plan: Karina Aguirre is a 63 y.o. 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  P53 WT, MMRp.  The patient is recovering well with no acute postop issues. She has a benign exam today. We reviewed continued expectations and restrictions.  Signs and symptoms of endometrial cancer recurrence were reviewed.  We discussed her pathology report and prognostic features today. We discussed that no additional therapy is indicated as she DOES NOT meet GOG-99 high intermediate risk criteria.  We discussed surveillance plan with visits every 6 months alternating between my office and her OB/GYN.  I will see her for her first visit in 6 months.  Discussed increasing water  intake and adding fiber supplementation to  help treat constipation.  20 minutes of total time was spent for this patient encounter, including preparation, face-to-face counseling with the patient and coordination of care, and documentation of the encounter.  Comer Dollar, MD  Division of Gynecologic Oncology  Department of Obstetrics and Gynecology  [facility]

## 2023-12-05 ENCOUNTER — Telehealth: Payer: Self-pay | Admitting: Orthopedic Surgery

## 2023-12-05 NOTE — Telephone Encounter (Signed)
 I called patient. Appt scheduled with Herlene for Wednesday in [place].

## 2023-12-05 NOTE — Telephone Encounter (Signed)
 Pt called stating she want a call from [facility]. She was past pt of [facility] in [place] and pt really need Deane to call her. Pt phone number is [phone].

## 2023-12-07 ENCOUNTER — Other Ambulatory Visit: Payer: Self-pay

## 2023-12-07 ENCOUNTER — Ambulatory Visit (INDEPENDENT_AMBULATORY_CARE_PROVIDER_SITE_OTHER): Admitting: Surgical

## 2023-12-07 ENCOUNTER — Encounter: Payer: Self-pay | Admitting: Surgical

## 2023-12-07 DIAGNOSIS — M545 Low back pain, unspecified: Secondary | ICD-10-CM

## 2023-12-07 NOTE — Progress Notes (Signed)
 Office Visit Note   Patient: Karina Aguirre           Date of Birth: 04/13/1960           MRN: [id number] Visit Date: 12/07/2023 Requested by: Sheryle Carwin, MD [address] [place],  [place] [address] PCP: Sheryle Carwin, MD  Subjective: Chief Complaint  Patient presents with   low back pain    HPI: GEORGIANA Aguirre is a 63 y.o. female who presents to the office reporting low back pain.  She states that she has history of back pain as well as left leg radicular pain that has previously been improved with lumbar spine ESI with Dr. Eldonna.  Overall she was doing pretty well until she had a hysterectomy earlier this year for stage I endometrial cancer.  Since that, she has noticed increased low back pain that makes it very difficult for her to sleep and walk without pain.  She has pain primarily around the midline axial lumbar spine with some radiation across primarily to the left paraspinal region.  She has difficulty with turning over in bed.  Takes ibuprofen 800 mg and occasional Tylenol  with some relief.  She has had prior MRI of the lumbar spine in 2024 as well as prior lumbar spine ESI.  She denies any current numbness or tingling in either leg and no radicular symptoms.  No incontinence or saddle anesthesia.  She works as a Merchandiser, retail at [employer] and in her free time she enjoys cleaning and watching TV.  She does not take any blood thinners.  No consistent medication that she takes aside from omeprazole ..                ROS: All systems reviewed are negative as they relate to the chief complaint within the history of present illness.  Patient denies fevers or chills.  Assessment & Plan: Visit Diagnoses:  1. Pain of lumbar spine     Plan: Impression is 63 year old female who presents for evaluation of focal lumbar spine pain without left leg radiculopathy.  She has prior MRI from April 2024 demonstrating prominent disc space narrowing at L2-L3 with marrow edema of both endplates as well  as some small disc bulges and mild facet arthritis noted at multiple levels such as L4-L5 and L3-L4.  Based on the locations of maximal tenderness on exam, seems like she may have more facet joint pain rather than pain from the degenerative changes at L2-L3 but difficult to exactly determine this based on exam alone.  Will plan to try lumbar spine ESI versus facet joint injection per Dr. Lyda best judgment.  We can see her back after she tries 1 or both of these injections at different intervals and see how she did.  If no improvement, we could try left SI joint injection as well and see if that is helpful.  She does keep up with regular walking.  No overt surgical indication for now.  Follow-up after procedures.  Follow-Up Instructions: No follow-ups on file.   Orders:  Orders Placed This Encounter  Procedures   DG Lumbar Spine 2-3 Views   Ambulatory referral to Physical Medicine Rehab   No orders of the defined types were placed in this encounter.     Procedures: No procedures performed   Clinical Data: No additional findings.  Objective: Vital Signs: There were no vitals taken for this visit.  Physical Exam:  Constitutional: Patient appears well-developed HEENT:  Head: Normocephalic Eyes:EOM are normal  Neck: Normal range of motion Cardiovascular: Normal rate Pulmonary/chest: Effort normal Neurologic: Patient is alert Skin: Skin is warm Psychiatric: Patient has normal mood and affect  Ortho Exam: Ortho exam demonstrates intact hip flexion, quadricep, hamstring, dorsiflexion, plantarflexion, EHL rated 5/5 bilaterally.  No clonus noted bilaterally.  Negative straight leg raise bilaterally.  She has no pain with hip range of motion of the right hip and minimal pain with hip range of motion of the left hip except for some mild to moderate pain that is reproduced with external rotation of the hip in the supine position.  She has some mild tenderness over the greater trochanter of  the left hip but not the right.  Maximal tenderness is around L4-L5 with really no significant tenderness around L1 or L2.  She does have some mild to moderate tenderness over the left SI joint but not the right.  Specialty Comments:  No specialty comments available.  Imaging: No results found.   PMFS History: Patient Active Problem List   Diagnosis Date Noted   Endometrial cancer (HCC) 09/15/2023   Nausea with vomiting 07/15/2023   Gallbladder polyp 07/15/2023   Disc degeneration, lumbar 02/02/2023   Partial nontraumatic tear of left rotator cuff 08/04/2022   Tendinitis of upper biceps tendon of left shoulder 08/04/2022   Impingement syndrome of left shoulder 05/26/2022   Trochanteric bursitis, left hip 12/25/2021   Protrusion of lumbar intervertebral disc 10/01/2021   Carpal tunnel syndrome of right wrist 06/21/2016   Pain in left ankle and joints of left foot 01/15/2016   CHEST PAIN 11/07/2009   Past Medical History:  Diagnosis Date   Arthritis    Diverticulitis    Endometrial cancer (HCC)    GERD (gastroesophageal reflux disease)    Osteopenia    Pancreatitis    1981   Seizure (HCC)    last seizure in 20s (only two), took medication for long time to prevent    Family History  Problem Relation Age of Onset   Breast cancer Mother 68   Heart murmur Mother    Heart disease Mother    Hypertension Father    Diabetes Father    Hypertension Sister    Hyperlipidemia Sister    Hypertension Brother    Hyperlipidemia Brother    Asthma Maternal Grandmother    Bone cancer Paternal Grandmother    Prostate cancer Neg Hx    Endometrial cancer Neg Hx    Ovarian cancer Neg Hx    Colon cancer Neg Hx    Pancreatic cancer Neg Hx     Past Surgical History:  Procedure Laterality Date   BREAST SURGERY Left    x2   ELBOW SURGERY Left    INJECTION, FOR SENTINEL LYMPH NODE IDENTIFICATION N/A 09/15/2023   Procedure: INJECTION, FOR SENTINEL LYMPH NODE IDENTIFICATION;  Surgeon:  Viktoria Comer SAUNDERS, MD;  Location: [facility];  Service: Gynecology;  Laterality: N/A;   NASAL SINUS SURGERY  1981   x2   ROBOTIC ASSISTED TOTAL HYSTERECTOMY WITH BILATERAL SALPINGO OOPHERECTOMY Bilateral 09/15/2023   Procedure: HYSTERECTOMY, TOTAL, ROBOT-ASSISTED, LAPAROSCOPIC, WITH BILATERAL SALPINGO-OOPHORECTOMY;  Surgeon: Viktoria Comer SAUNDERS, MD;  Location: [facility];  Service: Gynecology;  Laterality: Bilateral;   ROTATOR CUFF REPAIR Left    UMBILICAL HERNIA REPAIR  1981   unsure re mesh   Social History   Occupational History   Occupation: Merchandiser, retail  Tobacco Use   Smoking status: Never   Smokeless tobacco: Never  Vaping Use   Vaping status: Never Used  Substance and Sexual Activity   Alcohol use: No   Drug use: No   Sexual activity: Yes

## 2023-12-08 ENCOUNTER — Telehealth: Payer: Self-pay | Admitting: Physical Medicine and Rehabilitation

## 2023-12-08 NOTE — Telephone Encounter (Signed)
 Pt returning call to schedule appt. Pt call back number is [phone].

## 2023-12-19 ENCOUNTER — Other Ambulatory Visit: Payer: Self-pay

## 2023-12-19 ENCOUNTER — Ambulatory Visit: Admitting: Physical Medicine and Rehabilitation

## 2023-12-19 DIAGNOSIS — M5416 Radiculopathy, lumbar region: Secondary | ICD-10-CM | POA: Diagnosis not present

## 2023-12-19 MED ORDER — METHYLPREDNISOLONE ACETATE 40 MG/ML IJ SUSP
40.0000 mg | Freq: Once | INTRAMUSCULAR | Status: AC
  Administered 2023-12-19: 40 mg

## 2023-12-19 NOTE — Procedures (Signed)
 Lumbar Epidural Steroid Injection - Interlaminar Approach with Fluoroscopic Guidance  Patient: Karina Aguirre      Date of Birth: 12/26/1960 MRN: [id number] PCP: Sheryle Carwin, MD      Visit Date: 12/19/2023   Universal Protocol:     Consent Given By: the patient  Position: PRONE  Additional Comments: Vital signs were monitored before and after the procedure. Patient was prepped and draped in the usual sterile fashion. The correct patient, procedure, and site was verified.   Injection Procedure Details:   Procedure diagnoses: Lumbar radiculopathy [M54.16]   Meds Administered:  Meds ordered this encounter  Medications   methylPREDNISolone  acetate (DEPO-MEDROL ) injection 40 mg     Laterality: Left  Location/Site:  L3-4  Needle: 3.5 in., 20 ga. Tuohy  Needle Placement: Paramedian epidural  Findings:   -Comments: Excellent flow of contrast into the epidural space.  Procedure Details: Using a paramedian approach from the side mentioned above, the region overlying the inferior lamina was localized under fluoroscopic visualization and the soft tissues overlying this structure were infiltrated with 4 ml. of 1% Lidocaine  without Epinephrine. The Tuohy needle was inserted into the epidural space using a paramedian approach.   The epidural space was localized using loss of resistance along with counter oblique bi-planar fluoroscopic views.  After negative aspirate for air, blood, and CSF, a 2 ml. volume of Isovue -250 was injected into the epidural space and the flow of contrast was observed. Radiographs were obtained for documentation purposes.    The injectate was administered into the level noted above.   Additional Comments:  The patient tolerated the procedure well Dressing: 2 x 2 sterile gauze and Band-Aid    Post-procedure details: Patient was observed during the procedure. Post-procedure instructions were reviewed.  Patient left the clinic in stable condition.

## 2023-12-19 NOTE — Progress Notes (Addendum)
 Karina Aguirre - 63 y.o. female MRN [id number]  Date of birth: [birth year]  Office Visit Note: Visit Date: 12/19/2023 PCP: Sheryle Carwin, MD Referred by: Sheryle Carwin, MD  Subjective: Chief Complaint  Patient presents with   Lower Back - Pain   HPI:  [name] is a 63 y.o. female who comes in today at the request of Karina Calix, PA-C for planned Left L3-4 Lumbar Interlaminar epidural steroid injection with fluoroscopic guidance.  The patient has failed conservative care including home exercise, medications, time and activity modification.  This injection will be diagnostic and hopefully therapeutic.  Please see requesting physician notes for further details and justification.   ROS Otherwise per HPI.  Assessment & Plan: Visit Diagnoses:    ICD-10-CM   1. Lumbar radiculopathy  M54.16 XR C-ARM NO REPORT    Epidural Steroid injection    methylPREDNISolone  acetate (DEPO-MEDROL ) injection 40 mg      Plan: No additional findings.   Meds & Orders:  Meds ordered this encounter  Medications   methylPREDNISolone  acetate (DEPO-MEDROL ) injection 40 mg    Orders Placed This Encounter  Procedures   XR C-ARM NO REPORT   Epidural Steroid injection    Follow-up: Return if symptoms worsen or fail to improve.   Procedures: No procedures performed  Lumbar Epidural Steroid Injection - Interlaminar Approach with Fluoroscopic Guidance  Patient: Karina Aguirre      Date of Birth: 1961-02-06 MRN: [id number] PCP: Sheryle Carwin, MD      Visit Date: 12/19/2023   Universal Protocol:     Consent Given By: the patient  Position: PRONE  Additional Comments: Vital signs were monitored before and after the procedure. Patient was prepped and draped in the usual sterile fashion. The correct patient, procedure, and site was verified.   Injection Procedure Details:   Procedure diagnoses: Lumbar radiculopathy [M54.16]   Meds Administered:  Meds ordered this encounter  Medications    methylPREDNISolone  acetate (DEPO-MEDROL ) injection 40 mg     Laterality: Left  Location/Site:  L3-4  Needle: 3.5 in., 20 ga. Tuohy  Needle Placement: Paramedian epidural  Findings:   -Comments: Excellent flow of contrast into the epidural space.  Procedure Details: Using a paramedian approach from the side mentioned above, the region overlying the inferior lamina was localized under fluoroscopic visualization and the soft tissues overlying this structure were infiltrated with 4 ml. of 1% Lidocaine  without Epinephrine. The Tuohy needle was inserted into the epidural space using a paramedian approach.   The epidural space was localized using loss of resistance along with counter oblique bi-planar fluoroscopic views.  After negative aspirate for air, blood, and CSF, a 2 ml. volume of Isovue -250 was injected into the epidural space and the flow of contrast was observed. Radiographs were obtained for documentation purposes.    The injectate was administered into the level noted above.   Additional Comments:  The patient tolerated the procedure well Dressing: 2 x 2 sterile gauze and Band-Aid    Post-procedure details: Patient was observed during the procedure. Post-procedure instructions were reviewed.  Patient left the clinic in stable condition.   Clinical History: No specialty comments available.     Objective:  VS:  HT:    WT:   BMI:     BP:   HR: bpm  TEMP: ( )  RESP:  Physical Exam Vitals and nursing note reviewed.  Constitutional:      General: She is not in acute distress.    Appearance:  Normal appearance. She is not ill-appearing.  HENT:     Head: Normocephalic and atraumatic.     Right Ear: External ear normal.     Left Ear: External ear normal.  Eyes:     Extraocular Movements: Extraocular movements intact.  Cardiovascular:     Rate and Rhythm: Normal rate.     Pulses: Normal pulses.  Pulmonary:     Effort: Pulmonary effort is normal. No respiratory  distress.  Abdominal:     General: There is no distension.     Palpations: Abdomen is soft.  Musculoskeletal:        General: Tenderness present.     Cervical back: Neck supple.     Right lower leg: No edema.     Left lower leg: No edema.     Comments: Patient has good distal strength with no pain over the greater trochanters.  No clonus or focal weakness.  Skin:    Findings: No erythema, lesion or rash.  Neurological:     General: No focal deficit present.     Mental Status: She is alert and oriented to person, place, and time.     Sensory: No sensory deficit.     Motor: No weakness or abnormal muscle tone.     Coordination: Coordination normal.  Psychiatric:        Mood and Affect: Mood normal.        Behavior: Behavior normal.      Imaging: XR C-ARM NO REPORT Result Date: 12/19/2023 Please see Notes tab for imaging impression.  Epidural Steroid injection Result Date: 12/19/2023 Eldonna Novel, MD     12/19/2023 10:10 AM Lumbar Epidural Steroid Injection - Interlaminar Approach with Fluoroscopic Guidance Patient: Karina Aguirre     Date of Birth: [birth year] MRN: [id number] PCP: Sheryle Carwin, MD     Visit Date: 12/19/2023  Universal Protocol:   Consent Given By: the patient Position: PRONE Additional Comments: Vital signs were monitored before and after the procedure. Patient was prepped and draped in the usual sterile fashion. The correct patient, procedure, and site was verified. Injection Procedure Details: Procedure diagnoses: Lumbar radiculopathy [M54.16] Meds Administered: Meds ordered this encounter Medications  methylPREDNISolone  acetate (DEPO-MEDROL ) injection 40 mg  Laterality: Left Location/Site:  L3-4 Needle: 3.5 in., 20 ga. Tuohy Needle Placement: Paramedian epidural Findings:  -Comments: Excellent flow of contrast into the epidural space. Procedure Details: Using a paramedian approach from the side mentioned above, the region overlying the inferior lamina was localized  under fluoroscopic visualization and the soft tissues overlying this structure were infiltrated with 4 ml. of 1% Lidocaine  without Epinephrine. The Tuohy needle was inserted into the epidural space using a paramedian approach. The epidural space was localized using loss of resistance along with counter oblique bi-planar fluoroscopic views.  After negative aspirate for air, blood, and CSF, a 2 ml. volume of Isovue -250 was injected into the epidural space and the flow of contrast was observed. Radiographs were obtained for documentation purposes.  The injectate was administered into the level noted above. Additional Comments: The patient tolerated the procedure well Dressing: 2 x 2 sterile gauze and Band-Aid  Post-procedure details: Patient was observed during the procedure. Post-procedure instructions were reviewed. Patient left the clinic in stable condition.

## 2023-12-19 NOTE — Progress Notes (Signed)
 Pain Scale   Low back pain. Average Pain 4-5, worse at night.        +Driver, -BT, -Dye Allergies. Vitamin E allergy.

## 2024-01-02 ENCOUNTER — Encounter: Payer: Self-pay | Admitting: Radiology

## 2024-04-13 ENCOUNTER — Inpatient Hospital Stay: Admitting: Gynecologic Oncology
